# Patient Record
Sex: Male | Born: 1950 | Race: White | Hispanic: No | Marital: Married | Smoking: Never smoker
Health system: Southern US, Community
[De-identification: ages and names within clinical notes are randomized; demographics above are authoritative.]

## PROBLEM LIST (undated history)

## (undated) DIAGNOSIS — R1319 Other dysphagia: Secondary | ICD-10-CM

## (undated) DIAGNOSIS — N189 Chronic kidney disease, unspecified: Secondary | ICD-10-CM

## (undated) DIAGNOSIS — Z87442 Personal history of urinary calculi: Secondary | ICD-10-CM

## (undated) DIAGNOSIS — R1310 Dysphagia, unspecified: Secondary | ICD-10-CM

## (undated) DIAGNOSIS — Z87438 Personal history of other diseases of male genital organs: Secondary | ICD-10-CM

## (undated) DIAGNOSIS — K219 Gastro-esophageal reflux disease without esophagitis: Secondary | ICD-10-CM

## (undated) DIAGNOSIS — I10 Essential (primary) hypertension: Secondary | ICD-10-CM

## (undated) DIAGNOSIS — Z8679 Personal history of other diseases of the circulatory system: Secondary | ICD-10-CM

## (undated) DIAGNOSIS — Z8719 Personal history of other diseases of the digestive system: Secondary | ICD-10-CM

## (undated) DIAGNOSIS — M1990 Unspecified osteoarthritis, unspecified site: Secondary | ICD-10-CM

## (undated) DIAGNOSIS — Z8509 Personal history of malignant neoplasm of other digestive organs: Secondary | ICD-10-CM

## (undated) DIAGNOSIS — E785 Hyperlipidemia, unspecified: Secondary | ICD-10-CM

## (undated) HISTORY — PX: TONSILLECTOMY: SUR1361

## (undated) HISTORY — PX: LITHOTRIPSY: SUR834

## (undated) HISTORY — DX: Essential (primary) hypertension: I10

## (undated) HISTORY — DX: Chronic kidney disease, unspecified: N18.9

## (undated) HISTORY — DX: Personal history of other diseases of the digestive system: Z87.19

## (undated) HISTORY — PX: UPPER GASTROINTESTINAL ENDOSCOPY: SHX188

## (undated) HISTORY — DX: Personal history of malignant neoplasm of other digestive organs: Z85.09

## (undated) HISTORY — DX: Personal history of other diseases of the circulatory system: Z86.79

## (undated) HISTORY — DX: Other dysphagia: R13.19

## (undated) HISTORY — DX: Hyperlipidemia, unspecified: E78.5

## (undated) HISTORY — DX: Gastro-esophageal reflux disease without esophagitis: K21.9

## (undated) HISTORY — DX: Dysphagia, unspecified: R13.10

## (undated) HISTORY — DX: Personal history of other diseases of male genital organs: Z87.438

## (undated) HISTORY — DX: Unspecified osteoarthritis, unspecified site: M19.90

## (undated) HISTORY — PX: COLONOSCOPY: SHX174

---

## 2006-12-20 HISTORY — PX: ANKLE FUSION: SHX881

## 2008-09-26 ENCOUNTER — Ambulatory Visit: Payer: Self-pay | Admitting: Internal Medicine

## 2008-10-08 ENCOUNTER — Ambulatory Visit: Payer: Self-pay | Admitting: Internal Medicine

## 2008-11-29 ENCOUNTER — Telehealth: Payer: Self-pay | Admitting: Internal Medicine

## 2008-12-02 ENCOUNTER — Ambulatory Visit: Payer: Self-pay | Admitting: Internal Medicine

## 2008-12-05 ENCOUNTER — Ambulatory Visit: Payer: Self-pay | Admitting: Internal Medicine

## 2008-12-05 DIAGNOSIS — K2990 Gastroduodenitis, unspecified, without bleeding: Secondary | ICD-10-CM

## 2008-12-05 DIAGNOSIS — K2970 Gastritis, unspecified, without bleeding: Secondary | ICD-10-CM | POA: Insufficient documentation

## 2008-12-05 LAB — CONVERTED CEMR LAB: UREASE: NEGATIVE

## 2008-12-09 ENCOUNTER — Telehealth: Payer: Self-pay | Admitting: Internal Medicine

## 2008-12-09 ENCOUNTER — Encounter: Payer: Self-pay | Admitting: Gastroenterology

## 2008-12-09 DIAGNOSIS — R1906 Epigastric swelling, mass or lump: Secondary | ICD-10-CM | POA: Insufficient documentation

## 2008-12-20 HISTORY — PX: JOINT REPLACEMENT: SHX530

## 2008-12-26 ENCOUNTER — Encounter: Payer: Self-pay | Admitting: Gastroenterology

## 2008-12-26 ENCOUNTER — Ambulatory Visit (HOSPITAL_COMMUNITY): Admission: RE | Admit: 2008-12-26 | Discharge: 2008-12-26 | Payer: Self-pay | Admitting: Gastroenterology

## 2009-01-02 ENCOUNTER — Encounter: Payer: Self-pay | Admitting: Internal Medicine

## 2009-01-20 HISTORY — PX: REMOVAL OF GASTROINTESTINAL STOMATIC  TUMOR OF STOMACH: SHX6339

## 2009-01-23 ENCOUNTER — Encounter: Payer: Self-pay | Admitting: Internal Medicine

## 2009-02-11 ENCOUNTER — Inpatient Hospital Stay (HOSPITAL_COMMUNITY): Admission: RE | Admit: 2009-02-11 | Discharge: 2009-02-13 | Payer: Self-pay | Admitting: General Surgery

## 2009-02-11 ENCOUNTER — Encounter (INDEPENDENT_AMBULATORY_CARE_PROVIDER_SITE_OTHER): Payer: Self-pay | Admitting: *Deleted

## 2009-02-11 ENCOUNTER — Encounter (INDEPENDENT_AMBULATORY_CARE_PROVIDER_SITE_OTHER): Payer: Self-pay | Admitting: General Surgery

## 2009-03-06 ENCOUNTER — Encounter: Payer: Self-pay | Admitting: Internal Medicine

## 2009-11-09 IMAGING — CR DG CHEST 2V
2 series · 2 of 2 positions shown · non-contrast
Comparison: None

CLINICAL DATA: Preoperative respiratory examination for gastric
surgery.

CHEST - 2 VIEW

[view not recorded (1 of 2)]
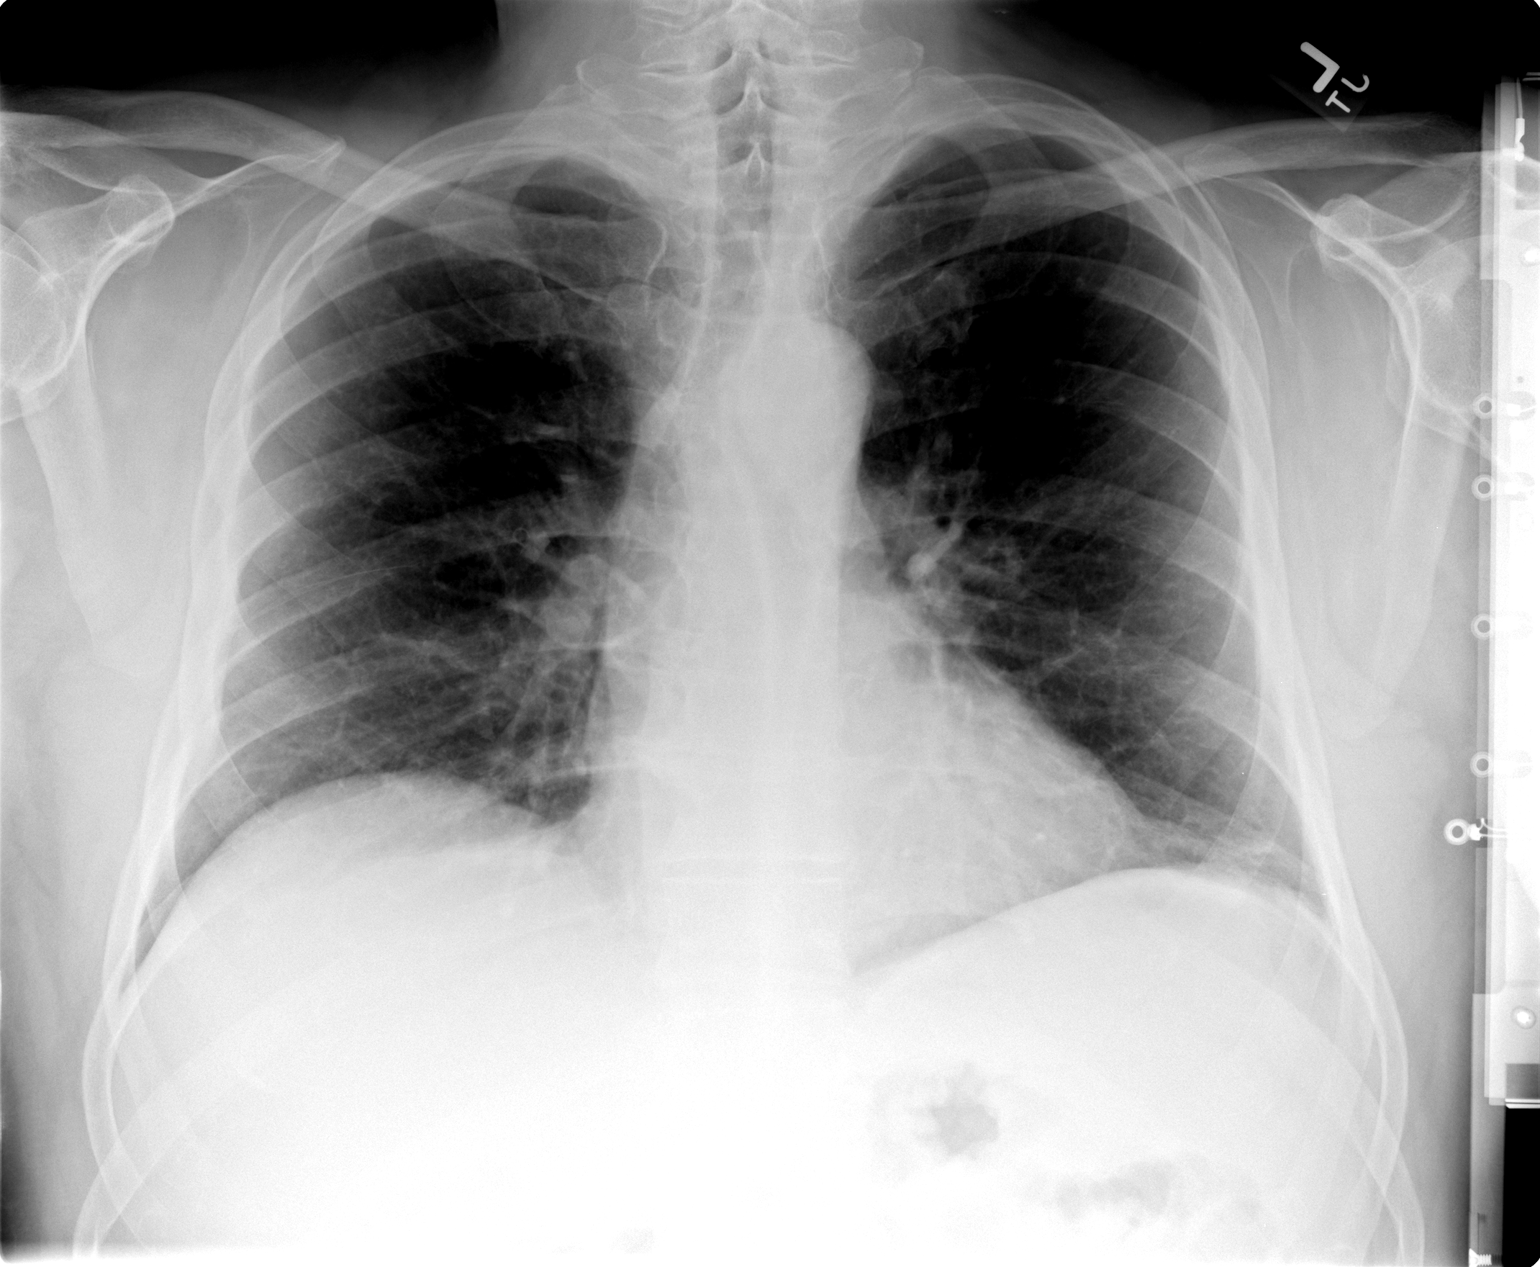

[view not recorded (2 of 2)]
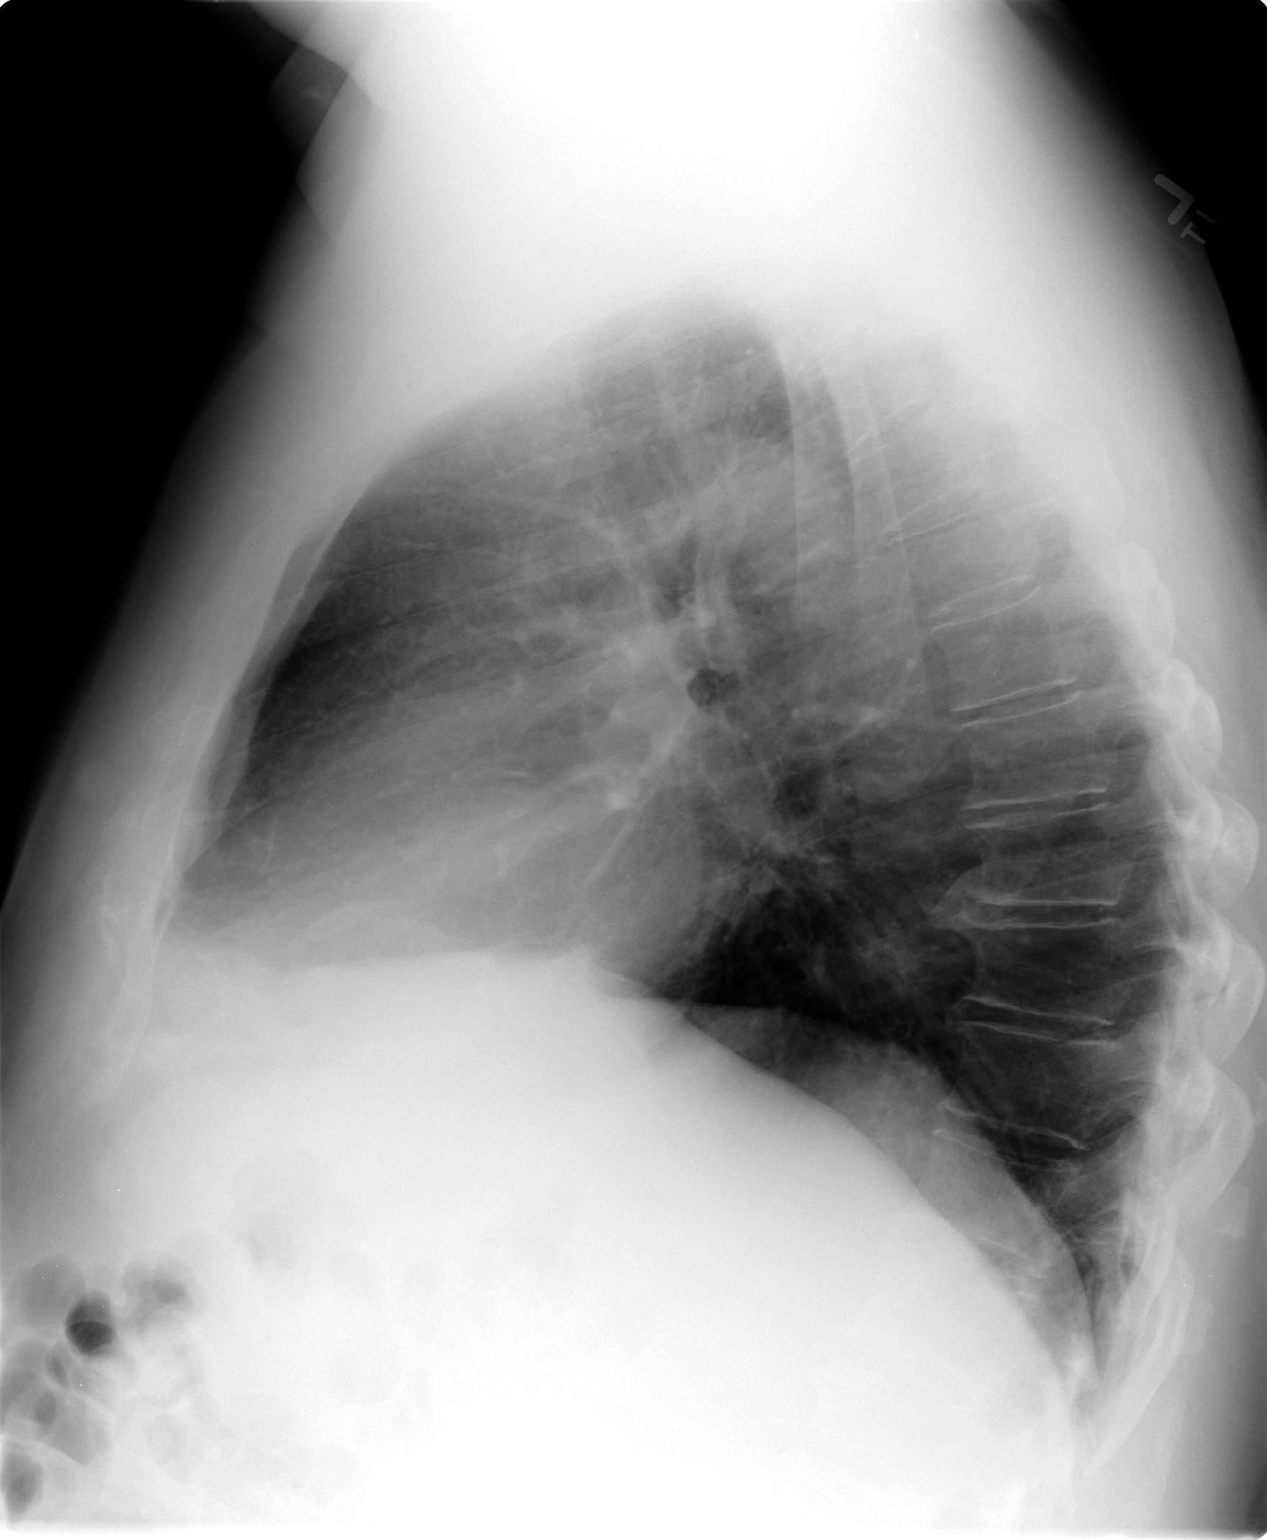

[2 of 2 positions shown; findings below may reference images not displayed]

FINDINGS: Heart size is normal.  The mediastinum is unremarkable.
The lungs show minimal scarring at the bases but no sign of active
infiltrate, mass, effusion or collapse.  Ordinary degenerative
changes effect the spine.
IMPRESSION: No active disease.  Minimal basilar scarring.

## 2010-03-16 ENCOUNTER — Encounter (INDEPENDENT_AMBULATORY_CARE_PROVIDER_SITE_OTHER): Payer: Self-pay | Admitting: *Deleted

## 2010-03-16 ENCOUNTER — Encounter: Admission: RE | Admit: 2010-03-16 | Discharge: 2010-03-16 | Payer: Self-pay | Admitting: General Surgery

## 2010-10-01 ENCOUNTER — Encounter (INDEPENDENT_AMBULATORY_CARE_PROVIDER_SITE_OTHER): Payer: Self-pay | Admitting: *Deleted

## 2010-11-11 ENCOUNTER — Ambulatory Visit: Payer: Self-pay | Admitting: Internal Medicine

## 2010-11-11 DIAGNOSIS — R1012 Left upper quadrant pain: Secondary | ICD-10-CM

## 2010-11-11 DIAGNOSIS — K219 Gastro-esophageal reflux disease without esophagitis: Secondary | ICD-10-CM

## 2010-11-11 DIAGNOSIS — K222 Esophageal obstruction: Secondary | ICD-10-CM | POA: Insufficient documentation

## 2010-11-11 DIAGNOSIS — R1310 Dysphagia, unspecified: Secondary | ICD-10-CM | POA: Insufficient documentation

## 2010-12-01 ENCOUNTER — Ambulatory Visit: Payer: Self-pay | Admitting: Internal Medicine

## 2011-01-19 NOTE — Assessment & Plan Note (Signed)
Summary: RECURRENT DYSPHAGIA...   History of Present Illness Visit Type: Initial Visit Primary GI MD: Yancey Flemings MD Primary Provider: Jiles Garter, MD Chief Complaint: Intermittant acid reflux with even taking Prilosec qd and sometimes bid. Pt has a lot of solid food dysphagia with acid reflux. Pt has left sided abd pain where GIST was removed last year. Pt states the pain is becoming increasingly worse and more frequent.  History of Present Illness:   60 year old white male with a history of hypertension, hyperlipidemia, renal nephrolithiasis, osteoarthritis, and GERD complicated by peptic stricture requiring esophageal dilation. 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 Followup CT scan March 2011 negative. Negative screening colonoscopy October 2009. He presents now with chief complaint of worsening heartburn and recurrent dysphasia. Last endoscopy with esophageal dilation December 2009 to a maximum of 54 [place] Maloney. Also erosive gastritis with negative H. pylori status. Continues to take NSAIDs. Reports symptoms for a month or so. Improved after increasing PPI to b.i.d. Currently on Prilosec 20 mg daily. Also with some left-sided abdominal discomfort near his previous incision that he notices when sitting in a car for long periods of time. Review of systems otherwise negative. He is accompanied by his wife.   GI Review of Systems    Reports abdominal pain, acid reflux, dysphagia with solids, and  heartburn.     Location of  Abdominal pain: left side.    Denies belching, bloating, chest pain, dysphagia with liquids, loss of appetite, nausea, vomiting, vomiting blood, weight loss, and  weight gain.        Denies anal fissure, black tarry stools, change in bowel habit, constipation, diarrhea, diverticulosis, fecal incontinence, heme positive stool, hemorrhoids, irritable bowel syndrome, jaundice, light color stool, liver problems, rectal  bleeding, and  rectal pain. Preventive Screening-Counseling & Management  Alcohol-Tobacco     Smoking Status: never      Drug Use:  no.      Current Medications (verified): 1)  Prilosec 20 Mg Cpdr (Omeprazole) .... One Tablet By Mouth Once Daily 2)  Aspirin 325 Mg  Tabs (Aspirin) .... One Tablet By Mouth Once Daily 3)  Flomax 0.4 Mg Caps (Tamsulosin Hcl) .... One Capsule By Mouth Once Daily 4)  Simcor 500-20 Mg Xr24h-Tab (Niacin-Simvastatin) .... One Tablet By Mouth Once Daily 5)  Enalapril Maleate 5 Mg Tabs (Enalapril Maleate) .... One Tablet By Mouth Once Daily 6)  Hydrocodone-Acetaminophen 5-500 Mg Tabs (Hydrocodone-Acetaminophen) .... One Tablet By Mouth As Needed 7)  Ibuprofen 200 Mg Caps (Ibuprofen) .... One Capsule By Mouth Once Daily As Needed  Allergies (verified): No Known Drug Allergies  Past History:  Past Medical History: GIST tumor Hypertension Kidney Stones Hyperlipidemia Arthritis GERD  Past Surgical History: Left ankle fusion Right Knee replacement GIST removal 2010 Right Knee Arthroscopy  Family History: Family History of Breast Cancer:Aunt Family History of Ovarian Cancer:Aunt, Grandmother  Social History: Msrried Self employed Patient has never smoked.  Alcohol Use - no Daily Caffeine Use Illicit Drug Use - no Smoking Status:  never Drug Use:  no  Review of Systems       The patient complains of arthritis/joint pain.  The patient denies allergy/sinus, anemia, anxiety-new, back pain, blood in urine, breast changes/lumps, change in vision, confusion, cough, coughing up blood, depression-new, fainting, fatigue, fever, headaches-new, hearing problems, heart murmur, heart rhythm changes, itching, menstrual pain, muscle pains/cramps, night sweats, nosebleeds, pregnancy symptoms, shortness of breath, skin rash, sleeping problems, sore throat, swelling of feet/legs,  swollen lymph glands, thirst - excessive , urination - excessive , urination  changes/pain, urine leakage, vision changes, and voice change.    Vital Signs:  Patient profile:   60 year old male Height:      74 inches Weight:      248 pounds BMI:     31.96 Pulse rate:   77 / minute Pulse rhythm:   regular BP sitting:   118 / 72  (left arm) Cuff size:   regular  Vitals Entered By: Christie Nottingham CMA Duncan Dull) (November 11, 2010 9:41 AM)  Physical Exam  General:  Well developed, well nourished, no acute distress. Head:  Normocephalic and atraumatic. Eyes:  PERRLA, no icterus. Nose:  No deformity, discharge,  or lesions. Mouth:  No deformity or lesions, dentition normal. Neck:  Supple; no masses or thyromegaly. Lungs:  Clear throughout to auscultation. Heart:  Regular rate and rhythm; no murmurs, rubs,  or bruits. Abdomen:  Soft, nontender and nondistended. No masses, hepatosplenomegaly or hernias noted. Normal bowel sounds. Msk:  orthopedic boot on foot Extremities:  no edema Neurologic:  Alert and  oriented x4. Skin:  Intact without significant lesions or rashes. Psych:  Alert and cooperative. Normal mood and affect.   Impression & Recommendations:  Problem # 1:  GERD (ICD-530.81) recent exacerbation of GERD. Improved with high-dose PPI. Now back on once daily PPI  Plan: #1. Reflux precautions #2. Continue once daily PPI for now #3. Will assess esophageal mucosa with EGD. See below  Problem # 2:  DYSPHAGIA UNSPECIFIED (ICD-787.20) recurrent solid food dysphagia in the face of worsening pyrosis. Somewhat improved with improving pyrosis and increased PPI temporarily. Suspect edema on top of stricture.  Plan: #1. Upper endoscopy with esophageal dilation. The nature of the procedure as well as the risks, benefits, and alternatives have been reviewed. He understood and agreed to proceed  Problem # 3:  ESOPHAGEAL STRICTURE (ICD-530.3) Assessment: Deteriorated  Orders: EGD SAV (EGD SAV)  Problem # 4:  ABDOMINAL PAIN-LUQ (ICD-789.02) positional left  upper quadrant pain most consistent with musculoskeletal etiology. Negative CT scan earlier this year. Upcoming endoscopy planned. Reassurance provided  Patient Instructions: 1)  EGD [facility] 12/01/10 8:30 am arrive at 7:30 am 2)  Upper Endoscopy brochure given.  3)  Upper Endoscopy with Dilatation brochure given.  4)  Copy sent to : Jiles Garter, MD, Glenna Fellows M.D. 5)  The medication list was reviewed and reconciled.  All changed / newly prescribed medications were explained.  A complete medication list was provided to the patient / caregiver.

## 2011-01-19 NOTE — Letter (Signed)
Summary: EGD Instructions  [facility]  [address] [place], [place] [address]   Phone: [phone]  Fax: [phone]       Brad Mcdaniel    1951/08/15    MRN: [id number]       Procedure Day /Date:TUESDAY 12/01/10     Arrival Time: 7:30 AM     Procedure Time:8:30 AM     Location of Procedure:                    [facility] (4th Floor)   PREPARATION FOR ENDOSCOPY WITH DIL.   On TUESDAY 12/01/10 THE DAY OF THE PROCEDURE:  1.   No solid foods, milk or milk products are allowed after midnight the night before your procedure.  2.   Do not drink anything colored red or purple.  Avoid juices with pulp.  No orange juice.  3.  You may drink clear liquids until 6:30 AM, which is 2 hours before your procedure.                                                                                                CLEAR LIQUIDS INCLUDE: Water Jello Ice Popsicles Tea (sugar ok, no milk/cream) Powdered fruit flavored drinks Coffee (sugar ok, no milk/cream) Gatorade Juice: apple, white grape, white cranberry  Lemonade Clear bullion, consomm, broth Carbonated beverages (any kind) Strained chicken noodle soup Hard Candy   MEDICATION INSTRUCTIONS  Unless otherwise instructed, you should take regular prescription medications with a small sip of water as early as possible the morning of your procedure.         OTHER INSTRUCTIONS  You will need a responsible adult at least 60 years of age to accompany you and drive you home.   This person must remain in the waiting room during your procedure.  Wear loose fitting clothing that is easily removed.  Leave jewelry and other valuables at home.  However, you may wish to bring a book to read or an iPod/MP3 player to listen to music as you wait for your procedure to start.  Remove all body piercing jewelry and leave at home.  Total time from sign-in until discharge is approximately 2-3 hours.  You should go home  directly after your procedure and rest.  You can resume normal activities the day after your procedure.  The day of your procedure you should not:   Drive   Make legal decisions   Operate machinery   Drink alcohol   Return to work  You will receive specific instructions about eating, activities and medications before you leave.    The above instructions have been reviewed and explained to me by   _______________________    I fully understand and can verbalize these instructions _____________________________ Date _________

## 2011-01-19 NOTE — Letter (Signed)
Summary: New Patient letter  [facility]  [address] [place], [place] [address]   Phone: [phone]  Fax: [phone]       10/01/2010 MRN: [id number]  Brad Mcdaniel [address] [place], [place]  [address]  Dear Brad Mcdaniel,  Welcome to the Gastroenterology Division at [facility].    You are scheduled to see Dr. Marina Goodell on 11/11/2010 at 9:15AM on the 3rd floor at [facility], [address]  We ask that you try to arrive at our office 15 minutes prior to your appointment time to allow for check-in.  We would like you to complete the enclosed self-administered evaluation form prior to your visit and bring it with you on the day of your appointment.  We will review it with you.  Also, please bring a complete list of all your medications or, if you prefer, bring the medication bottles and we will list them.  Please bring your insurance card so that we may make a copy of it.  If your insurance requires a referral to see a specialist, please bring your referral form from your primary care physician.  Co-payments are due at the time of your visit and may be paid by cash, check or credit card.     Your office visit will consist of a consult with your physician (includes a physical exam), any laboratory testing he/she may order, scheduling of any necessary diagnostic testing (e.g. x-ray, ultrasound, CT-scan), and scheduling of a procedure (e.g. Endoscopy, Colonoscopy) if required.  Please allow enough time on your schedule to allow for any/all of these possibilities.    If you cannot keep your appointment, please call [phone] to cancel or reschedule prior to your appointment date.  This allows [place] the opportunity to schedule an appointment for another patient in need of care.  If you do not cancel or reschedule by 5 p.m. the business day prior to your appointment date, you will be charged a $50.00 late cancellation/no-show fee.    Thank you for choosing [facility] for your medical needs.  We appreciate the opportunity to care for you.  Please visit [place] at our website  to learn more about our practice.                     Sincerely,                                                             The Gastroenterology Division

## 2011-01-21 NOTE — Miscellaneous (Signed)
Summary: wants Rx mailed   Clinical Lists Changes  Medications: Added new medication of PRILOSEC OTC 20 MG  TBEC (OMEPRAZOLE MAGNESIUM) 1 twice a day 30 minutes before meals - Signed Rx of PRILOSEC OTC 20 MG  TBEC (OMEPRAZOLE MAGNESIUM) 1 twice a day 30 minutes before meals;  #62 x 11;  Signed;  Entered by: Doristine Church RN II;  Authorized by: Hilarie Fredrickson MD;  Method used: Print then Give to Patient Observations: Added new observation of ALLERGY REV: Done (12/02/2010 8:22) Added new observation of NKA: T (12/02/2010 8:22)    Prescriptions: PRILOSEC OTC 20 MG  TBEC (OMEPRAZOLE MAGNESIUM) 1 twice a day 30 minutes before meals  #62 x 11   Entered by:   Doristine Church RN II   Authorized by:   Hilarie Fredrickson MD   Signed by:   Doristine Church RN II on 12/02/2010   Method used:   Print then Give to Patient   RxID:   [id number]

## 2011-01-21 NOTE — Procedures (Signed)
Summary: Upper Endoscopy  Patient: [name] Note: All result statuses are Final unless otherwise noted.  Tests: (1) Upper Endoscopy (EGD)   EGD Upper Endoscopy       DONE      [facility]     [address]     [place], [place]  [address]           ENDOSCOPY PROCEDURE REPORT           PATIENT:  Brad Mcdaniel, Brad Mcdaniel  MR#:  [id number]     BIRTHDATE:  06-02-1951, 59 yrs. old  GENDER:  male           ENDOSCOPIST:  Wilhemina Bonito. Eda Keys, MD     Referred by:  Office           PROCEDURE DATE:  12/01/2010     PROCEDURE:  EGD, diagnostic 43235,     Maloney Dilation of Esophagus - 42F     ASA CLASS:  Class II     INDICATIONS:  dysphagia, dilation of esophageal stricture           MEDICATIONS:   Fentanyl 75 mcg IV, Versed 7 mg IV     TOPICAL ANESTHETIC:  Exactacain Spray           DESCRIPTION OF PROCEDURE:   After the risks benefits and     alternatives of the procedure were thoroughly explained, informed     consent was obtained.  The LB GIF-H180 [id number] endoscope was     introduced through the mouth and advanced to the second portion of     the duodenum, without limitations.  The instrument was slowly     withdrawn as the mucosa was fully examined.     <<PROCEDUREIMAGES>>           A benign stricture was found in the distal esophagus. Esophagitis     was found in the distal esophagus as well. Staples from prior     sugery in proximal stomach.   Retroflexed views revealed no     abnormalities. Otherwise, normal exam to D2.    The scope was then     withdrawn from the patient and the procedure completed.           THERAPY: MALONEY DILATION 42F W/O RESISTANCE OR HEME. TOLERATED     WELL           COMPLICATIONS:  None           ENDOSCOPIC IMPRESSION:     1) Stricture in the distal esophagus - S/P DILATION 42F     2) Esophagitis in the distal esophagus     3) Prior surgery     4) GERD           RECOMMENDATIONS:     1) Clear liquids until 11 am, then soft foods rest of day.  Resume prior diet tomorrow.     2) Continue PRILOSEC 20MG  TWICE DAILY           ______________________________     Wilhemina Bonito. Eda Keys, MD           CC:  Cheri Rous, MD, Glenna Fellows, MD, The Patient           n.     eSIGNED:   Wilhemina Bonito. Eda Keys at 12/01/2010 09:01 AM           Adalberto Cole, [id number]  Note: An exclamation mark (!) indicates a result that was not dispersed into the  flowsheet. Document Creation Date: 12/01/2010 9:01 AM _______________________________________________________________________  (1) Order result status: Final Collection or observation date-time: 12/01/2010 08:45 Requested date-time:  Receipt date-time:  Reported date-time:  Referring Physician:   Ordering Physician: Fransico Setters [phone]) Specimen Source:  Source: Launa Grill Order Number: [id number] Lab site:

## 2011-04-06 LAB — COMPREHENSIVE METABOLIC PANEL
Alkaline Phosphatase: 59 U/L (ref 39–117)
BUN: 15 mg/dL (ref 6–23)
CO2: 27 mEq/L (ref 19–32)
Chloride: 106 mEq/L (ref 96–112)
Creatinine, Ser: 0.96 mg/dL (ref 0.4–1.5)
GFR calc Af Amer: >60 mL/min (ref >60)
Potassium: 4.1 mEq/L (ref 3.5–5.1)
Sodium: 140 mEq/L (ref 135–145)

## 2011-04-06 LAB — CBC
HCT: 44.8 % (ref 39.0–52.0)
MCHC: 33.5 g/dL (ref 30.0–36.0)
MCV: 89.2 fL (ref 78.0–100.0)
Platelets: 216 10*3/uL (ref 150–400)
RBC: 5.02 MIL/uL (ref 4.22–5.81)
RDW: 13.5 % (ref 11.5–15.5)

## 2011-04-06 LAB — DIFFERENTIAL
Basophils Absolute: 0 10*3/uL (ref 0.0–0.1)
Basophils Relative: 0 % (ref 0–1)
Eosinophils Absolute: 0.2 10*3/uL (ref 0.0–0.7)
Lymphocytes Relative: 29 % (ref 12–46)
Lymphs Abs: 2 10*3/uL (ref 0.7–4.0)
Monocytes Absolute: 0.8 10*3/uL (ref 0.1–1.0)
Monocytes Relative: 12 % (ref 3–12)
Neutro Abs: 3.9 10*3/uL (ref 1.7–7.7)

## 2011-04-06 LAB — URINALYSIS, ROUTINE W REFLEX MICROSCOPIC
Nitrite: NEGATIVE
Specific Gravity, Urine: 1.026 (ref 1.005–1.030)

## 2011-05-04 NOTE — Op Note (Signed)
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   SURGICAL PROCEDURES:  Laparoscopic excision gastrointestinal stromal  tumor, proximal stomach.   SURGEON:  Brad Mcdaniel. Mcdaniel, M.D.   ANESTHESIA:  General.   BRIEF HISTORY:  Brad Mcdaniel is a 60 year old male who recently underwent  upper endoscopy by Dr. Marina Mcdaniel for symptoms of indigestion and reflux.  He  had a distal esophageal stricture and some erosions which were dilated,  but incidentally was noted an approximately 2.5-cm submucosal mass in  the proximal stomach.  The patient has subsequently undergone ultrasound  and fine needle aspiration by Dr. Christella Mcdaniel, revealing a 2.3-cm homogeneous  lesion in the muscularis propria with normal mucosa, and fine-needle  aspiration showed spindle cells consistent with GIST.  With these  findings, I have recommended proceeding with laparoscopic and possible  open resection.   INDICATIONS FOR THE PROCEDURE:  Risks of anesthetic complications,  bleeding, infection, leakage were discussed with the patient and his  family.  He is now brought to the operating room for this procedure.   DESCRIPTION OF OPERATION:  The patient was brought to the operating room  and placed in the supine position on the operating table, and general  endotracheal anesthesia was induced.  The abdomen was widely sterilely  prepped and draped.  Correct patient and procedure were verified.  He  had received preoperative IV antibiotics and pneumatic and  embolism  stockings were in place.  Trocar sites were infiltrated with local  anesthesia.  Access was obtained in the left subcostal space with  Optiview trocar without difficulty and pneumoperitoneum established.  There was no evidence of trocar injury.  Under direct vision, a 12-mm  trocar was placed well laterally in the right upper quadrant, another 12-  mm trocar in the right mid abdomen, and an 11-mm trocar to the left and  above the umbilicus for the camera.  Through a 5-mm subxiphoid site,  Nathanson retractor was placed and the left lobe of liver elevated.  There were some adhesions of the omentum up to the diaphragm in the left  upper quadrant for reasons that were not clear and I did not take these  down as they were not in the way.  The anterior stomach proximally was  exposed and palpating along the anterior stomach I did feel a firm mass.  Upper endoscopy was then performed by Dr. Daphine Mcdaniel and we could visualize  the tumor nicely in the proximal stomach, and it seemed to be along the  greater curve about 4-6 cm from the EG junction.  At this point, we went  ahead and did an anterior  gastrotomy with the Harmonic scalpel and  opened the anterior gastric wall for 4-5 cm.  We were then able to get a  good view of the gastric wall intraluminally and the tumor was actually  sitting in the mid posterior gastric wall at this level.  It was very  mobile and we elected to resect it transgastric through the anterior  gastrotomy we had created.  I placed a figure-of-eight suture deeply  through the mass, and then was able to elevate it up through the  anterior gastrotomy, again quite mobile, and we placed a 60-mm gold  Echelon stapler posterior to the mass and got what looked like a very  nice gross margin of normal gastric wall behind the mass.  The stapler  was then fired and the specimen attached and placed in the EndoCatch  bag.  This was left secured with a suture through one of the  trocar  sites for later retrieval.  The posterior gastric staple line was then  oversewn with a running 2-0 Vicryl suture for hemostasis.  There was no  bleeding.  We then proceeded to close the anterior gastrotomy.  This was  done with a full-thickness running 2-0 Vicryl suture begun at either end  of the gastrotomy and tied centrally using the Endo Stitch.  Following  this, an anterior inverting seromuscular suture of 2-0 silk was used.  At this point, Dr. Daphine Mcdaniel again performed upper endoscopy.  The sutured  staple lines appeared intact and without bleeding, and with the stomach  distended there was no evidence of any air leak under saline irrigation.  The stomach was then desufflated.  The anterior gastrotomy was  additionally coated with Tisseel tissue sealant.  There was no evidence  of trocar injury or bleeding.  The Nathanson retractor was removed under  direct vision.  The specimen was removed through the left upper quadrant  trocar site after dilating it slightly with a ring clamp.  All CO2 was  evacuated and trocars removed.  Skin incisions were closed with  subcuticular Monocryl and Dermabond.  Sponge, needle and instrument  counts were correct.  The patient was taken to recovery in good  condition.      Brad Mcdaniel. Mcdaniel, M.D.  Electronically Signed     BTH/MEDQ  D:  02/11/2009  T:  02/12/2009  Job:  [id number]

## 2011-12-24 ENCOUNTER — Telehealth: Payer: Self-pay

## 2011-12-24 NOTE — Telephone Encounter (Signed)
Left message regarding faxed prilosec request

## 2011-12-29 ENCOUNTER — Telehealth: Payer: Self-pay

## 2011-12-29 NOTE — Telephone Encounter (Signed)
Called Ms. Bullinger to verify that I was going to [employer] rx

## 2012-03-21 ENCOUNTER — Encounter (INDEPENDENT_AMBULATORY_CARE_PROVIDER_SITE_OTHER): Payer: Self-pay | Admitting: General Surgery

## 2012-05-19 ENCOUNTER — Ambulatory Visit (INDEPENDENT_AMBULATORY_CARE_PROVIDER_SITE_OTHER): Payer: Commercial Managed Care - PPO | Admitting: General Surgery

## 2012-05-19 ENCOUNTER — Encounter (INDEPENDENT_AMBULATORY_CARE_PROVIDER_SITE_OTHER): Payer: Self-pay | Admitting: General Surgery

## 2012-05-19 VITALS — BP 130/84 | HR 71 | Temp 98.1°F | Resp 16 | Ht 73.5 in | Wt 236.8 lb

## 2012-05-19 DIAGNOSIS — D214 Benign neoplasm of connective and other soft tissue of abdomen: Secondary | ICD-10-CM

## 2012-05-19 NOTE — Progress Notes (Signed)
Chief complaint: Followup GI ST tumor of stomach  History: Patient returns for long-term followup status post laparoscopic excision of a 3.5 cm GIST of the proximal stomach.  He has been feeling well. He has lost quite a bit of weight and has had return of his energy. He denies any abdominal pain, nausea, bloating, difficulty with eating, change in his bowel movements, melena or hematochezia. He has not had any endoscopy or other imaging studies since I saw him one year ago.  Exam: Gen.: Appears well Nodes: No cervical, super clavicular, or no nodes palpable Lungs: Clear equal breath sounds Abdomen: Soft and nontender. Wounds well healed without hernias. No discernible masses or organomegaly.  Assessment and plan: Clinically doing well now over 3 years following resection of GI ST tumor of the proximal stomach. To be complete I think that one followup CT scan would be helpful to rule out any evidence of local recurrence. He will look into doing that possibly this year. Return in one year.

## 2013-03-08 ENCOUNTER — Other Ambulatory Visit (INDEPENDENT_AMBULATORY_CARE_PROVIDER_SITE_OTHER): Payer: Self-pay

## 2013-03-09 ENCOUNTER — Telehealth (INDEPENDENT_AMBULATORY_CARE_PROVIDER_SITE_OTHER): Payer: Self-pay | Admitting: General Surgery

## 2013-03-09 NOTE — Telephone Encounter (Signed)
Left message with  appt day and time 03/13/13 @ 2pm  No solids 4 hrs prior  He needs to pick up  Contrast 03/12/13

## 2013-03-13 ENCOUNTER — Ambulatory Visit
Admission: RE | Admit: 2013-03-13 | Discharge: 2013-03-13 | Disposition: A | Discharge status: Home / Self Care | Payer: Self-pay | Source: Ambulatory Visit | Attending: General Surgery | Admitting: General Surgery

## 2013-03-13 DIAGNOSIS — D214 Benign neoplasm of connective and other soft tissue of abdomen: Secondary | ICD-10-CM

## 2013-03-13 MED ORDER — IOHEXOL 300 MG/ML  SOLN
125.0000 mL | Freq: Once | INTRAMUSCULAR | Status: AC | PRN
  Administered 2013-03-13: 125 mL via INTRAVENOUS

## 2013-03-16 ENCOUNTER — Telehealth (INDEPENDENT_AMBULATORY_CARE_PROVIDER_SITE_OTHER): Payer: Self-pay

## 2013-03-16 NOTE — Telephone Encounter (Signed)
Patient given CT results (no recurrent GIST tumor or metastatic disease)

## 2013-04-20 ENCOUNTER — Ambulatory Visit (INDEPENDENT_AMBULATORY_CARE_PROVIDER_SITE_OTHER): Payer: Commercial Managed Care - PPO | Admitting: General Surgery

## 2016-03-22 DIAGNOSIS — E785 Hyperlipidemia, unspecified: Secondary | ICD-10-CM | POA: Diagnosis not present

## 2016-03-26 DIAGNOSIS — I10 Essential (primary) hypertension: Secondary | ICD-10-CM | POA: Diagnosis not present

## 2016-03-26 DIAGNOSIS — E782 Mixed hyperlipidemia: Secondary | ICD-10-CM | POA: Diagnosis not present

## 2016-03-26 DIAGNOSIS — M19079 Primary osteoarthritis, unspecified ankle and foot: Secondary | ICD-10-CM | POA: Diagnosis not present

## 2016-03-26 DIAGNOSIS — M19072 Primary osteoarthritis, left ankle and foot: Secondary | ICD-10-CM | POA: Diagnosis not present

## 2016-11-26 DIAGNOSIS — S92414A Nondisplaced fracture of proximal phalanx of right great toe, initial encounter for closed fracture: Secondary | ICD-10-CM | POA: Diagnosis not present

## 2017-01-07 DIAGNOSIS — M1712 Unilateral primary osteoarthritis, left knee: Secondary | ICD-10-CM | POA: Diagnosis not present

## 2017-03-22 DIAGNOSIS — M25572 Pain in left ankle and joints of left foot: Secondary | ICD-10-CM | POA: Diagnosis not present

## 2017-03-22 DIAGNOSIS — M25571 Pain in right ankle and joints of right foot: Secondary | ICD-10-CM | POA: Diagnosis not present

## 2017-03-22 DIAGNOSIS — G8929 Other chronic pain: Secondary | ICD-10-CM | POA: Diagnosis not present

## 2017-03-23 DIAGNOSIS — Z23 Encounter for immunization: Secondary | ICD-10-CM | POA: Diagnosis not present

## 2017-03-23 DIAGNOSIS — Z6834 Body mass index (BMI) 34.0-34.9, adult: Secondary | ICD-10-CM | POA: Diagnosis not present

## 2017-03-23 DIAGNOSIS — Z9181 History of falling: Secondary | ICD-10-CM | POA: Diagnosis not present

## 2017-03-23 DIAGNOSIS — Z1389 Encounter for screening for other disorder: Secondary | ICD-10-CM | POA: Diagnosis not present

## 2017-03-23 DIAGNOSIS — Z0000 Encounter for general adult medical examination without abnormal findings: Secondary | ICD-10-CM | POA: Diagnosis not present

## 2017-03-24 DIAGNOSIS — N3020 Other chronic cystitis without hematuria: Secondary | ICD-10-CM | POA: Diagnosis not present

## 2017-03-24 DIAGNOSIS — N201 Calculus of ureter: Secondary | ICD-10-CM | POA: Diagnosis not present

## 2017-03-24 DIAGNOSIS — N200 Calculus of kidney: Secondary | ICD-10-CM | POA: Diagnosis not present

## 2017-03-25 ENCOUNTER — Other Ambulatory Visit: Payer: Self-pay | Admitting: Orthopedic Surgery

## 2017-03-29 ENCOUNTER — Other Ambulatory Visit: Payer: Self-pay | Admitting: Orthopedic Surgery

## 2017-03-29 DIAGNOSIS — G8929 Other chronic pain: Secondary | ICD-10-CM

## 2017-03-29 DIAGNOSIS — M25572 Pain in left ankle and joints of left foot: Principal | ICD-10-CM

## 2017-03-29 DIAGNOSIS — M25571 Pain in right ankle and joints of right foot: Principal | ICD-10-CM

## 2017-04-05 ENCOUNTER — Other Ambulatory Visit: Payer: Self-pay | Admitting: Orthopedic Surgery

## 2017-04-05 DIAGNOSIS — G8929 Other chronic pain: Secondary | ICD-10-CM

## 2017-04-05 DIAGNOSIS — E785 Hyperlipidemia, unspecified: Secondary | ICD-10-CM | POA: Diagnosis not present

## 2017-04-05 DIAGNOSIS — M25571 Pain in right ankle and joints of right foot: Principal | ICD-10-CM

## 2017-04-08 DIAGNOSIS — I10 Essential (primary) hypertension: Secondary | ICD-10-CM | POA: Diagnosis not present

## 2017-04-12 ENCOUNTER — Ambulatory Visit
Admission: RE | Admit: 2017-04-12 | Discharge: 2017-04-12 | Disposition: A | Discharge status: Home / Self Care | Payer: PPO | Source: Ambulatory Visit | Attending: Orthopedic Surgery | Admitting: Orthopedic Surgery

## 2017-04-12 DIAGNOSIS — M19071 Primary osteoarthritis, right ankle and foot: Secondary | ICD-10-CM | POA: Diagnosis not present

## 2017-04-12 DIAGNOSIS — G8929 Other chronic pain: Secondary | ICD-10-CM

## 2017-04-12 DIAGNOSIS — M25571 Pain in right ankle and joints of right foot: Principal | ICD-10-CM

## 2017-04-29 DIAGNOSIS — M19071 Primary osteoarthritis, right ankle and foot: Secondary | ICD-10-CM | POA: Diagnosis not present

## 2017-05-06 ENCOUNTER — Ambulatory Visit: Payer: Self-pay | Admitting: Orthopedic Surgery

## 2017-05-16 ENCOUNTER — Ambulatory Visit: Payer: Self-pay | Admitting: Orthopedic Surgery

## 2017-05-16 NOTE — H&P (Signed)
Brad Mcdaniel DOB: 04/28/1951 Married / Language: Brad Mcdaniel / Race: White Male Date of Admission:  05/23/2017 Left Knee Pain History of Present Illness The patient is a 66 year old male who comes in for a preoperative History and Physical. The patient is scheduled for a left total knee arthroplasty to be performed by Dr. Dione Plover. [name], MD at [facility] on 05-23-2017. The patient is a 66 year old male who presented with knee complaints. The patient reports left knee symptoms including: pain, swelling, locking, catching, giving way, weakness, soreness and grinding which began 7 year(s) ago in association with an established activity (pt. had left ankle sx). The patient describes their pain as sharp, dull, aching and throbbing.The patient feels that the symptoms are worsening. This problem has not been previously evaluated. Past treatment for this problem has included application of ice and application of heat. Symptoms are reported to be located in the left knee and include knee pain, swelling, stiffness, decreased range of motion, instability and difficulty bearing weight. The patient reports that symptoms radiate to the left thigh (muscle cramps). Symptoms are exacerbated by motion at the knee and weight bearing. Symptoms are relieved by rest. Current treatment includes use of a walker (cane) and nonsteroidal anti-inflammatory drugs (Ibuprofen). Note for "Knee pain": left ankle sx causing pt. to walk different. HX right uni knee by Dr. Smitty Cords.  [name] was seen as a new evaluaiton of the left knee accompanied by his wife Remo Lipps. He was referred over to the clininc by his brother, Abdirahman Chittum, who has had both knees replaced. Catherine has been having issues with the left knee for several years, 3-4, and has been progressive especially over the past couple of years. It has been prgressive in nature and has impacted his activity and mobility. 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  He is unable to work on ladders for any length of time and tries to avoids steps due to pain. He feels the leg is getting weaker with time. He will have cramps in both thighs at times. The knee has reached a point where is hurts every day and has to use Ibuprofen every for it.  The knee pops, grinds, but no buckling. He does have night pain and sedintary stiffness. He is unalbe to ride in the car for any length of time before he has to get out and walk around. The left knee is not as bad as the right knee was before he had the partial knee placed in it. The right knee partial was done about 7 years ago and this knee wore out because his left ankle was fused about 8 or so years ago and the right knee assumed the weight as his gait shifted following the ankle fusion.  Left knee is bothering him most times now. He said that he has become inactive because of the left knee. He has pain, but even worse than the pain is the dysfunction. He cannot do anything he desires because the knee will not hold up for him. He wants to give out. It is definitely limiting what he can and cannot do. He is at a stage now where he would like to get this knee fixed. He has had a previous unicompartmental replacement in the right knee. He has done fairly well with that. He does not really have much pain with it. He says that he is functioning a lot better now than he did before the  surgery. He is ready to get the left knee fixed at this time.  They have been treated conservatively in the past for the above stated problem and despite conservative measures, they continue to have progressive pain and severe functional limitations and dysfunction. They have failed non-operative management including home exercise, medications. It is felt that they would benefit from undergoing total joint replacement. Risks and benefits of the procedure have been discussed with the patient and they elect to proceed with surgery. There are no active  contraindications to surgery such as ongoing infection or rapidly progressive neurological disease.  Problem List/Past Medical Primary osteoarthritis of left knee (M17.12)  Chronic pain of both ankles (M25.571, M25.572)  Gastroesophageal Reflux Disease  High blood pressure  Hypercholesterolemia  Kidney Stone  Prostate Disease  Enlarged Prostate  Allergies No Known Drug Allergies   Family History Cancer  Maternal Grandmother. Hypertension  Mother. Kidney disease  Father. Osteoarthritis  Brother. Father  Deceased. Auto Accident Mother  Deceased. Alzheimer's  Social History Children  3 Current work status  retired Furniture conservator/restorer weekly; does running / walking Living situation  live with spouse Marital status  married Never consumed alcohol  01/07/2017: Never consumed alcohol No history of drug/alcohol rehab  Not under pain contract  Number of flights of stairs before winded  greater than 5 Tobacco / smoke exposure  01/07/2017: no Tobacco use  Never smoker. 01/07/2017  Medication History  Multiple Vitamin (1 (one) Oral) Active. Pravastatin Sodium (40MG  Tablet, Oral) Active. Lisinopril (5MG  Tablet, Oral) Active. Finasteride (5MG  Tablet, Oral) Active. Tamsulosin HCl (0.4MG  Capsule, Oral) Active. Ibuprofen (200MG  Capsule, 1 (one) Oral) Active. Aspirin (81MG  Tablet, 1 (one) Oral) Active. PriLOSEC (20MG  Capsule DR, Oral) Active.  Past Surgical History  Ankle Surgery  left Lithotripsy  Date: 2005. Left Knee Scope  Date: 06/2007. Left Ankle Fusion  Date: 11/2007. Right Knee Scope  Date: 05/2008. 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 Right Knee Unicompartmental Replacement  Date: 10/2009.  Review of Systems General Not Present- Chills, Fatigue, Fever, Memory Loss, Night Sweats, Weight Gain and Weight Loss. Skin Not Present- Eczema, Hives, Itching, Lesions and Rash. HEENT Not Present- Dentures, Double Vision, Headache, Hearing Loss,  Tinnitus and Visual Loss. Respiratory Not Present- Allergies, Chronic Cough, Coughing up blood, Shortness of breath at rest and Shortness of breath with exertion. Cardiovascular Not Present- Chest Pain, Difficulty Breathing Lying Down, Murmur, Palpitations, Racing/skipping heartbeats and Swelling. Gastrointestinal Present- Heartburn. Not Present- Abdominal Pain, Bloody Stool, Constipation, Diarrhea, Difficulty Swallowing, Jaundice, Loss of appetitie, Nausea and Vomiting. Male Genitourinary Not Present- Blood in Urine, Discharge, Flank Pain, Incontinence, Painful Urination, Urgency, Urinary frequency, Urinary Retention, Urinating at Night and Weak urinary stream. Musculoskeletal Present- Joint Pain. Not Present- Back Pain, Joint Swelling, Morning Stiffness, Muscle Pain, Muscle Weakness and Spasms. Neurological Not Present- Blackout spells, Difficulty with balance, Dizziness, Paralysis, Tremor and Weakness. Psychiatric Not Present- Insomnia.  Vitals Weight: 233 lb Height: 73in Weight was reported by patient. Height was reported by patient. Body Surface Area: 2.3 m Body Mass Index: 30.74 kg/m  Pulse: 64 (Regular)  BP: 128/66 (Sitting, Right Arm, Standard)   Physical Exam General Mental Status -Alert, cooperative and good historian. General Appearance-pleasant, Not in acute distress. Orientation-Oriented X3. Build & Nutrition-Well nourished and Well developed.  Head and Neck Head-normocephalic, atraumatic . Neck Global Assessment - supple, no bruit auscultated on the right, no bruit auscultated on the left.  Eye Vision-Wears corrective lenses(readers). Pupil - Bilateral-Regular and Round. Motion - Bilateral-EOMI.  ENMT Note:  partial upper denture plate   Chest and Lung Exam Auscultation Breath sounds - clear at anterior chest wall and clear at posterior chest wall. Adventitious sounds - No Adventitious sounds.  Cardiovascular Auscultation Rhythm -  Regular rate and rhythm. Heart Sounds - S1 WNL and S2 WNL. Murmurs & Other Heart Sounds - Auscultation of the heart reveals - No Murmurs.  Abdomen Palpation/Percussion Tenderness - Abdomen is non-tender to palpation. Rigidity (guarding) - Abdomen is soft. Auscultation Auscultation of the abdomen reveals - Bowel sounds normal.  Male Genitourinary Note: Not done, not pertinent to present illness   Musculoskeletal Note: He is a well-developed male, alert and oriented, in no apparent distress. Evaluation of his hips show normal range of motion with no discomfort. His right knee shows no swelling. Range about 0 to 125 with no tenderness, crepitus or instability. Left knee, no effusion, slight varus, range 5 to 115. There is marked crepitus on range of motion of the right knee. He is tender medial greater than lateral with no instability noted. He has a fused left ankle. Pulse, sensation and motor are intact both lower extremities.  His radiographs, AP and lateral of that left knee show bone on bone arthritis in the medial and patellofemoral compartments with tibial subluxation and varus deformity.   Assessment & Plan  Primary osteoarthritis of left knee (M17.12)  Note:Surgical Plans: Left Total Knee Replacement  Disposition: Home, Straight to outpatient at [facility] in [place], [place] to start on June 7th or June 8th  PCP: Dr. Wyline Copas - Patient has been seen preoperatively and felt to be stable for surgery.  IV TXA  Anesthesia Issues: None  Patient was instructed on what medications to stop prior to surgery.  Signed electronically by Joelene Millin, III PA-C

## 2017-05-17 ENCOUNTER — Other Ambulatory Visit (HOSPITAL_COMMUNITY): Payer: Self-pay | Admitting: Emergency Medicine

## 2017-05-17 NOTE — Patient Instructions (Signed)
Brad Mcdaniel  05/17/2017   Your procedure is scheduled on: 05-23-17  Report to [facility] Main  Entrance    Report to admitting at 6AM    Call this number if you have problems the morning of surgery  [phone]   Remember: ONLY 1 PERSON MAY GO WITH YOU TO SHORT STAY TO GET  READY MORNING OF YOUR SURGERY.  Do not eat food or drink liquids :After Midnight.     Take these medicines the morning of surgery with A SIP OF WATER: omeprazole(prilosec), finasteride(flomax), tamsulosin(flomax), pravastatin(pravachol)                                You may not have any metal on your body including hair pins and              piercings  Do not wear jewelry, make-up, lotions, powders or perfumes, deodorant              Men may shave face and neck.   Do not bring valuables to the hospital. [place].  Contacts, dentures or bridgework may not be worn into surgery.  Leave suitcase in the car. After surgery it may be brought to your room.               Please read over the following fact sheets you were given: _____________________________________________________________________   [facility] - Preparing for Surgery Before surgery, you can play an important role.  Because skin is not sterile, your skin needs to be as free of germs as possible.  You can reduce the number of germs on your skin by washing with CHG (chlorahexidine gluconate) soap before surgery.  CHG is an antiseptic cleaner which kills germs and bonds with the skin to continue killing germs even after washing. Please DO NOT use if you have an allergy to CHG or antibacterial soaps.  If your skin becomes reddened/irritated stop using the CHG and inform your nurse when you arrive at Short Stay. Do not shave (including legs and underarms) for at least 48 hours prior to the first CHG shower.  You may shave your face/neck. Please follow these instructions  carefully:  1.  Shower with CHG Soap the night before surgery and the  morning of Surgery.  2.  If you choose to wash your hair, wash your hair first as usual with your  normal  shampoo.  3.  After you shampoo, rinse your hair and body thoroughly to remove the  shampoo.                           4.  Use CHG as you would any other liquid soap.  You can apply chg directly  to the skin and wash                       Gently with a scrungie or clean washcloth.  5.  Apply the CHG Soap to your body ONLY FROM THE NECK DOWN.   Do not use on face/ open  Wound or open sores. Avoid contact with eyes, ears mouth and genitals (private parts).                       Wash face,  Genitals (private parts) with your normal soap.             6.  Wash thoroughly, paying special attention to the area where your surgery  will be performed.  7.  Thoroughly rinse your body with warm water from the neck down.  8.  DO NOT shower/wash with your normal soap after using and rinsing off  the CHG Soap.                9.  Pat yourself dry with a clean towel.            10.  Wear clean pajamas.            11.  Place clean sheets on your bed the night of your first shower and do not  sleep with pets. Day of Surgery : Do not apply any lotions/deodorants the morning of surgery.  Please wear clean clothes to the hospital/surgery center.  FAILURE TO FOLLOW THESE INSTRUCTIONS MAY RESULT IN THE CANCELLATION OF YOUR SURGERY PATIENT SIGNATURE_________________________________  NURSE SIGNATURE__________________________________  ________________________________________________________________________   Brad Mcdaniel  An incentive spirometer is a tool that can help keep your lungs clear and active. This tool measures how well you are filling your lungs with each breath. Taking long deep breaths may help reverse or decrease the chance of developing breathing (pulmonary) problems (especially infection)  following:  A long period of time when you are unable to move or be active. BEFORE THE PROCEDURE   If the spirometer includes an indicator to show your best effort, your nurse or respiratory therapist will set it to a desired goal.  If possible, sit up straight or lean slightly forward. Try not to slouch.  Hold the incentive spirometer in an upright position. INSTRUCTIONS FOR USE  1. Sit on the edge of your bed if possible, or sit up as far as you can in bed or on a chair. 2. Hold the incentive spirometer in an upright position. 3. Breathe out normally. 4. Place the mouthpiece in your mouth and seal your lips tightly around it. 5. Breathe in slowly and as deeply as possible, raising the piston or the ball toward the top of the column. 6. Hold your breath for 3-5 seconds or for as long as possible. Allow the piston or ball to fall to the bottom of the column. 7. Remove the mouthpiece from your mouth and breathe out normally. 8. Rest for a few seconds and repeat Steps 1 through 7 at least 10 times every 1-2 hours when you are awake. Take your time and take a few normal breaths between deep breaths. 9. The spirometer may include an indicator to show your best effort. Use the indicator as a goal to work toward during each repetition. 10. After each set of 10 deep breaths, practice coughing to be sure your lungs are clear. If you have an incision (the cut made at the time of surgery), support your incision when coughing by placing a pillow or rolled up towels firmly against it. Once you are able to get out of bed, walk around indoors and cough well. You may stop using the incentive spirometer when instructed by your caregiver.  RISKS AND COMPLICATIONS  Take your time so you do not get  dizzy or light-headed.  If you are in pain, you may need to take or ask for pain medication before doing incentive spirometry. It is harder to take a deep breath if you are having pain. AFTER USE  Rest and  breathe slowly and easily.  It can be helpful to keep track of a log of your progress. Your caregiver can provide you with a simple table to help with this. If you are using the spirometer at home, follow these instructions: [place] IF:   You are having difficultly using the spirometer.  You have trouble using the spirometer as often as instructed.  Your pain medication is not giving enough relief while using the spirometer.  You develop fever of 100.5 F (38.1 C) or higher. SEEK IMMEDIATE MEDICAL CARE IF:   You cough up bloody sputum that had not been present before.  You develop fever of 102 F (38.9 C) or greater.  You develop worsening pain at or near the incision site. MAKE SURE YOU:   Understand these instructions.  Will watch your condition.  Will get help right away if you are not doing well or get worse. Document Released: 04/18/2007 Document Revised: 02/28/2012 Document Reviewed: 06/19/2007 ExitCare Patient Information 2014 Exit[employer], [place].   ________________________________________________________________________  WHAT IS A BLOOD TRANSFUSION? Blood Transfusion Information  A transfusion is the replacement of blood or some of its parts. Blood is made up of multiple cells which provide different functions.  Red blood cells carry oxygen and are used for blood loss replacement.  White blood cells fight against infection.  Platelets control bleeding.  Plasma helps clot blood.  Other blood products are available for specialized needs, such as hemophilia or other clotting disorders. BEFORE THE TRANSFUSION  Who gives blood for transfusions?   Healthy volunteers who are fully evaluated to make sure their blood is safe. This is blood bank blood. Transfusion therapy is the safest it has ever been in the practice of medicine. Before blood is taken from a donor, a complete history is taken to make sure that person has no history of diseases nor engages in  risky social behavior (examples are intravenous drug use or sexual activity with multiple partners). The donor's travel history is screened to minimize risk of transmitting infections, such as malaria. The donated blood is tested for signs of infectious diseases, such as HIV and hepatitis. The blood is then tested to be sure it is compatible with you in order to minimize the chance of a transfusion reaction. If you or a relative donates blood, this is often done in anticipation of surgery and is not appropriate for emergency situations. It takes many days to process the donated blood. RISKS AND COMPLICATIONS Although transfusion therapy is very safe and saves many lives, the main dangers of transfusion include:   Getting an infectious disease.  Developing a transfusion reaction. This is an allergic reaction to something in the blood you were given. Every precaution is taken to prevent this. The decision to have a blood transfusion has been considered carefully by your caregiver before blood is given. Blood is not given unless the benefits outweigh the risks. AFTER THE TRANSFUSION  Right after receiving a blood transfusion, you will usually feel much better and more energetic. This is especially true if your red blood cells have gotten low (anemic). The transfusion raises the level of the red blood cells which carry oxygen, and this usually causes an energy increase.  The nurse administering the transfusion will  monitor you carefully for complications. HOME CARE INSTRUCTIONS  No special instructions are needed after a transfusion. You may find your energy is better. Speak with your caregiver about any limitations on activity for underlying diseases you may have. SEEK MEDICAL CARE IF:   Your condition is not improving after your transfusion.  You develop redness or irritation at the intravenous (IV) site. SEEK IMMEDIATE MEDICAL CARE IF:  Any of the following symptoms occur over the next 12  hours:  Shaking chills.  You have a temperature by mouth above 102 F (38.9 C), not controlled by medicine.  Chest, back, or muscle pain.  People around you feel you are not acting correctly or are confused.  Shortness of breath or difficulty breathing.  Dizziness and fainting.  You get a rash or develop hives.  You have a decrease in urine output.  Your urine turns a dark color or changes to pink, red, or brown. Any of the following symptoms occur over the next 10 days:  You have a temperature by mouth above 102 F (38.9 C), not controlled by medicine.  Shortness of breath.  Weakness after normal activity.  The white part of the eye turns yellow (jaundice).  You have a decrease in the amount of urine or are urinating less often.  Your urine turns a dark color or changes to pink, red, or brown. Document Released: 12/03/2000 Document Revised: 02/28/2012 Document Reviewed: 07/22/2008 [facility] Patient Information 2014 [place], [place].  _______________________________________________________________________

## 2017-05-17 NOTE — Progress Notes (Signed)
LOV/cardiology clearance Wyline Copas MD 04-08-17 chart/epic EKG 04-08-17 chart

## 2017-05-18 ENCOUNTER — Encounter (HOSPITAL_COMMUNITY): Payer: Self-pay

## 2017-05-18 ENCOUNTER — Encounter (HOSPITAL_COMMUNITY)
Admission: RE | Admit: 2017-05-18 | Discharge: 2017-05-18 | Disposition: A | Discharge status: Home / Self Care | Payer: PPO | Source: Ambulatory Visit | Attending: Orthopedic Surgery | Admitting: Orthopedic Surgery

## 2017-05-18 DIAGNOSIS — M1712 Unilateral primary osteoarthritis, left knee: Secondary | ICD-10-CM | POA: Diagnosis not present

## 2017-05-18 DIAGNOSIS — Z01818 Encounter for other preprocedural examination: Secondary | ICD-10-CM | POA: Insufficient documentation

## 2017-05-18 HISTORY — DX: Personal history of urinary calculi: Z87.442

## 2017-05-18 LAB — CBC
HCT: 44.6 % (ref 39.0–52.0)
Hemoglobin: 15 g/dL (ref 13.0–17.0)
MCH: 29.8 pg (ref 26.0–34.0)
MCHC: 33.6 g/dL (ref 30.0–36.0)
MCV: 88.5 fL (ref 78.0–100.0)
PLATELETS: 222 10*3/uL (ref 150–400)
RBC: 5.04 MIL/uL (ref 4.22–5.81)
RDW: 13.6 % (ref 11.5–15.5)
WBC: 7.4 10*3/uL (ref 4.0–10.5)

## 2017-05-18 LAB — COMPREHENSIVE METABOLIC PANEL
ALT: 16 U/L — ABNORMAL LOW (ref 17–63)
ANION GAP: 6 (ref 5–15)
AST: 19 U/L (ref 15–41)
Albumin: 4.2 g/dL (ref 3.5–5.0)
Alkaline Phosphatase: 49 U/L (ref 38–126)
BILIRUBIN TOTAL: 0.8 mg/dL (ref 0.3–1.2)
BUN: 23 mg/dL — ABNORMAL HIGH (ref 6–20)
CO2: 26 mmol/L (ref 22–32)
Calcium: 9.1 mg/dL (ref 8.9–10.3)
Chloride: 106 mmol/L (ref 101–111)
Creatinine, Ser: 1.1 mg/dL (ref 0.61–1.24)
GFR calc Af Amer: >60 mL/min (ref >60)
GFR calc non Af Amer: >60 mL/min (ref >60)
GLUCOSE: 93 mg/dL (ref 65–99)
POTASSIUM: 4.1 mmol/L (ref 3.5–5.1)
SODIUM: 138 mmol/L (ref 135–145)
Total Protein: 7.3 g/dL (ref 6.5–8.1)

## 2017-05-18 LAB — ABO/RH: ABO/RH(D): O POS

## 2017-05-18 LAB — SURGICAL PCR SCREEN
MRSA, PCR: NEGATIVE
Staphylococcus aureus: NEGATIVE

## 2017-05-18 LAB — PROTIME-INR
INR: 0.96
Prothrombin Time: 12.8 seconds (ref 11.4–15.2)

## 2017-05-18 LAB — APTT: aPTT: 29 seconds (ref 24–36)

## 2017-05-22 NOTE — Anesthesia Preprocedure Evaluation (Addendum)
Anesthesia Evaluation  Patient identified by MRN, date of birth, ID band Patient awake    Reviewed: Allergy & Precautions, H&P , NPO status , Patient's Chart, lab work & pertinent test results  Airway Mallampati: II  TM Distance: >3 FB Neck ROM: Full    Dental no notable dental hx. (+) Teeth Intact, Dental Advisory Given   Pulmonary neg pulmonary ROS,    Pulmonary exam normal breath sounds clear to auscultation       Cardiovascular Exercise Tolerance: Good negative cardio ROS   Rhythm:Regular Rate:Normal     Neuro/Psych negative neurological ROS  negative psych ROS   GI/Hepatic Neg liver ROS, GERD  Medicated and Controlled,  Endo/Other  negative endocrine ROS  Renal/GU negative Renal ROS  negative genitourinary   Musculoskeletal   Abdominal   Peds  Hematology negative hematology ROS (+)   Anesthesia Other Findings   Reproductive/Obstetrics negative OB ROS                            Anesthesia Physical Anesthesia Plan  ASA: II  Anesthesia Plan: Spinal   Post-op Pain Management:  Regional for Post-op pain   Induction: Intravenous  Airway Management Planned: Simple Face Mask  Additional Equipment:   Intra-op Plan:   Post-operative Plan:   Informed Consent: I have reviewed the patients History and Physical, chart, labs and discussed the procedure including the risks, benefits and alternatives for the proposed anesthesia with the patient or authorized representative who has indicated his/her understanding and acceptance.   Dental advisory given  Plan Discussed with: CRNA  Anesthesia Plan Comments:         Anesthesia Quick Evaluation

## 2017-05-23 ENCOUNTER — Encounter (HOSPITAL_COMMUNITY): Payer: Self-pay | Admitting: Certified Registered"

## 2017-05-23 ENCOUNTER — Encounter (HOSPITAL_COMMUNITY)
Admission: RE | Disposition: A | Discharge status: Home / Self Care | Payer: Self-pay | Source: Ambulatory Visit | Attending: Orthopedic Surgery

## 2017-05-23 ENCOUNTER — Inpatient Hospital Stay (HOSPITAL_COMMUNITY): Payer: PPO | Admitting: Anesthesiology

## 2017-05-23 ENCOUNTER — Inpatient Hospital Stay (HOSPITAL_COMMUNITY)
Admission: RE | Admit: 2017-05-23 | Discharge: 2017-05-24 | DRG: 470 | Disposition: A | Discharge status: Home / Self Care | Payer: PPO | Source: Ambulatory Visit | Attending: Orthopedic Surgery | Admitting: Orthopedic Surgery

## 2017-05-23 DIAGNOSIS — Z972 Presence of dental prosthetic device (complete) (partial): Secondary | ICD-10-CM | POA: Diagnosis not present

## 2017-05-23 DIAGNOSIS — M179 Osteoarthritis of knee, unspecified: Secondary | ICD-10-CM

## 2017-05-23 DIAGNOSIS — Z981 Arthrodesis status: Secondary | ICD-10-CM

## 2017-05-23 DIAGNOSIS — I10 Essential (primary) hypertension: Secondary | ICD-10-CM | POA: Diagnosis not present

## 2017-05-23 DIAGNOSIS — M25572 Pain in left ankle and joints of left foot: Secondary | ICD-10-CM | POA: Diagnosis not present

## 2017-05-23 DIAGNOSIS — N400 Enlarged prostate without lower urinary tract symptoms: Secondary | ICD-10-CM | POA: Diagnosis present

## 2017-05-23 DIAGNOSIS — G8918 Other acute postprocedural pain: Secondary | ICD-10-CM | POA: Diagnosis not present

## 2017-05-23 DIAGNOSIS — Z7982 Long term (current) use of aspirin: Secondary | ICD-10-CM

## 2017-05-23 DIAGNOSIS — Z8249 Family history of ischemic heart disease and other diseases of the circulatory system: Secondary | ICD-10-CM

## 2017-05-23 DIAGNOSIS — M25571 Pain in right ankle and joints of right foot: Secondary | ICD-10-CM | POA: Diagnosis present

## 2017-05-23 DIAGNOSIS — E7800 Pure hypercholesterolemia, unspecified: Secondary | ICD-10-CM | POA: Diagnosis not present

## 2017-05-23 DIAGNOSIS — K228 Other specified diseases of esophagus: Secondary | ICD-10-CM | POA: Diagnosis not present

## 2017-05-23 DIAGNOSIS — K219 Gastro-esophageal reflux disease without esophagitis: Secondary | ICD-10-CM | POA: Diagnosis present

## 2017-05-23 DIAGNOSIS — Z79899 Other long term (current) drug therapy: Secondary | ICD-10-CM

## 2017-05-23 DIAGNOSIS — Z96651 Presence of right artificial knee joint: Secondary | ICD-10-CM | POA: Diagnosis not present

## 2017-05-23 DIAGNOSIS — Z973 Presence of spectacles and contact lenses: Secondary | ICD-10-CM

## 2017-05-23 DIAGNOSIS — M1712 Unilateral primary osteoarthritis, left knee: Secondary | ICD-10-CM | POA: Diagnosis not present

## 2017-05-23 DIAGNOSIS — K2970 Gastritis, unspecified, without bleeding: Secondary | ICD-10-CM | POA: Diagnosis not present

## 2017-05-23 DIAGNOSIS — M25762 Osteophyte, left knee: Secondary | ICD-10-CM | POA: Diagnosis present

## 2017-05-23 DIAGNOSIS — M1710 Unilateral primary osteoarthritis, unspecified knee: Secondary | ICD-10-CM

## 2017-05-23 DIAGNOSIS — M25562 Pain in left knee: Secondary | ICD-10-CM | POA: Diagnosis not present

## 2017-05-23 HISTORY — PX: TOTAL KNEE ARTHROPLASTY: SHX125

## 2017-05-23 LAB — TYPE AND SCREEN
ABO/RH(D): O POS
Antibody Screen: NEGATIVE

## 2017-05-23 SURGERY — ARTHROPLASTY, KNEE, TOTAL
Anesthesia: Regional | Site: Knee | Laterality: Left

## 2017-05-23 MED ORDER — ACETAMINOPHEN 10 MG/ML IV SOLN
INTRAVENOUS | Status: AC
  Filled 2017-05-23: qty 100

## 2017-05-23 MED ORDER — DOCUSATE SODIUM 100 MG PO CAPS
100.0000 mg | ORAL_CAPSULE | Freq: Two times a day (BID) | ORAL | Status: DC
  Administered 2017-05-23 – 2017-05-24 (×2): 100 mg via ORAL
  Filled 2017-05-23 (×2): qty 1

## 2017-05-23 MED ORDER — LIDOCAINE 2% (20 MG/ML) 5 ML SYRINGE
INTRAMUSCULAR | Status: DC | PRN
  Administered 2017-05-23: 50 mg via INTRAVENOUS

## 2017-05-23 MED ORDER — CEFAZOLIN SODIUM-DEXTROSE 2-4 GM/100ML-% IV SOLN
2.0000 g | Freq: Four times a day (QID) | INTRAVENOUS | Status: AC
  Administered 2017-05-23 (×2): 2 g via INTRAVENOUS
  Filled 2017-05-23 (×2): qty 100

## 2017-05-23 MED ORDER — PHENYLEPHRINE 40 MCG/ML (10ML) SYRINGE FOR IV PUSH (FOR BLOOD PRESSURE SUPPORT)
PREFILLED_SYRINGE | INTRAVENOUS | Status: DC | PRN
  Administered 2017-05-23 (×2): 80 ug via INTRAVENOUS

## 2017-05-23 MED ORDER — METHOCARBAMOL 500 MG PO TABS: 500.0000 mg | ORAL_TABLET | Freq: Four times a day (QID) | ORAL | Status: DC | PRN

## 2017-05-23 MED ORDER — RIVAROXABAN 10 MG PO TABS
10.0000 mg | ORAL_TABLET | Freq: Every day | ORAL | Status: DC
  Administered 2017-05-24: 10 mg via ORAL
  Filled 2017-05-23: qty 1

## 2017-05-23 MED ORDER — ONDANSETRON HCL 4 MG PO TABS: 4.0000 mg | ORAL_TABLET | Freq: Four times a day (QID) | ORAL | Status: DC | PRN

## 2017-05-23 MED ORDER — DEXAMETHASONE SODIUM PHOSPHATE 10 MG/ML IJ SOLN
INTRAMUSCULAR | Status: AC
  Filled 2017-05-23: qty 1

## 2017-05-23 MED ORDER — LIDOCAINE 2% (20 MG/ML) 5 ML SYRINGE
INTRAMUSCULAR | Status: AC
  Filled 2017-05-23: qty 5

## 2017-05-23 MED ORDER — CEFAZOLIN SODIUM-DEXTROSE 2-4 GM/100ML-% IV SOLN
2.0000 g | INTRAVENOUS | Status: AC
  Administered 2017-05-23: 2 g via INTRAVENOUS

## 2017-05-23 MED ORDER — PRAVASTATIN SODIUM 20 MG PO TABS
40.0000 mg | ORAL_TABLET | Freq: Every day | ORAL | Status: DC
  Administered 2017-05-24: 10:00:00 40 mg via ORAL
  Filled 2017-05-23: qty 2

## 2017-05-23 MED ORDER — SODIUM CHLORIDE 0.9 % IJ SOLN
INTRAMUSCULAR | Status: DC | PRN
  Administered 2017-05-23: 60 mL

## 2017-05-23 MED ORDER — ONDANSETRON HCL 4 MG/2ML IJ SOLN: 4.0000 mg | Freq: Four times a day (QID) | INTRAMUSCULAR | Status: DC | PRN

## 2017-05-23 MED ORDER — PROPOFOL 500 MG/50ML IV EMUL
INTRAVENOUS | Status: DC | PRN
  Administered 2017-05-23: 75 ug/kg/min via INTRAVENOUS

## 2017-05-23 MED ORDER — ACETAMINOPHEN 10 MG/ML IV SOLN
1000.0000 mg | Freq: Once | INTRAVENOUS | Status: AC
  Administered 2017-05-23: 1000 mg via INTRAVENOUS

## 2017-05-23 MED ORDER — MIDAZOLAM HCL 2 MG/2ML IJ SOLN
2.0000 mg | Freq: Once | INTRAMUSCULAR | Status: AC
  Administered 2017-05-23: 2 mg via INTRAVENOUS

## 2017-05-23 MED ORDER — TAMSULOSIN HCL 0.4 MG PO CAPS
0.4000 mg | ORAL_CAPSULE | Freq: Every day | ORAL | Status: DC
  Administered 2017-05-24: 0.4 mg via ORAL
  Filled 2017-05-23: qty 1

## 2017-05-23 MED ORDER — DEXAMETHASONE SODIUM PHOSPHATE 10 MG/ML IJ SOLN
10.0000 mg | Freq: Once | INTRAMUSCULAR | Status: AC
  Administered 2017-05-23: 10 mg via INTRAVENOUS

## 2017-05-23 MED ORDER — POLYETHYLENE GLYCOL 3350 17 G PO PACK: 17.0000 g | PACK | Freq: Every day | ORAL | Status: DC | PRN

## 2017-05-23 MED ORDER — CEFAZOLIN SODIUM-DEXTROSE 2-4 GM/100ML-% IV SOLN
INTRAVENOUS | Status: AC
  Filled 2017-05-23: qty 100

## 2017-05-23 MED ORDER — ONDANSETRON HCL 4 MG/2ML IJ SOLN
INTRAMUSCULAR | Status: DC | PRN
  Administered 2017-05-23: 4 mg via INTRAVENOUS

## 2017-05-23 MED ORDER — MIDAZOLAM HCL 5 MG/5ML IJ SOLN
INTRAMUSCULAR | Status: DC | PRN
  Administered 2017-05-23: 2 mg via INTRAVENOUS

## 2017-05-23 MED ORDER — FENTANYL CITRATE (PF) 100 MCG/2ML IJ SOLN
INTRAMUSCULAR | Status: AC
  Filled 2017-05-23: qty 2

## 2017-05-23 MED ORDER — HYDROMORPHONE HCL 1 MG/ML IJ SOLN
0.5000 mg | INTRAMUSCULAR | Status: DC | PRN
  Administered 2017-05-23 – 2017-05-24 (×3): 1 mg via INTRAVENOUS
  Filled 2017-05-23 (×3): qty 1

## 2017-05-23 MED ORDER — BUPIVACAINE IN DEXTROSE 0.75-8.25 % IT SOLN
INTRATHECAL | Status: DC | PRN
  Administered 2017-05-23: 2 mL via INTRATHECAL

## 2017-05-23 MED ORDER — SODIUM CHLORIDE 0.9 % IJ SOLN
INTRAMUSCULAR | Status: AC
  Filled 2017-05-23: qty 10

## 2017-05-23 MED ORDER — PROPOFOL 10 MG/ML IV BOLUS
INTRAVENOUS | Status: AC
  Filled 2017-05-23: qty 20

## 2017-05-23 MED ORDER — LACTATED RINGERS IV SOLN
INTRAVENOUS | Status: DC
  Administered 2017-05-23 (×2): via INTRAVENOUS

## 2017-05-23 MED ORDER — METHOCARBAMOL 1000 MG/10ML IJ SOLN
500.0000 mg | Freq: Four times a day (QID) | INTRAVENOUS | Status: DC | PRN
  Filled 2017-05-23: qty 5

## 2017-05-23 MED ORDER — FINASTERIDE 5 MG PO TABS
5.0000 mg | ORAL_TABLET | Freq: Every day | ORAL | Status: DC
  Administered 2017-05-24: 5 mg via ORAL
  Filled 2017-05-23 (×2): qty 1

## 2017-05-23 MED ORDER — MENTHOL 3 MG MT LOZG: 1.0000 | LOZENGE | OROMUCOSAL | Status: DC | PRN

## 2017-05-23 MED ORDER — OXYCODONE HCL 5 MG PO TABS
5.0000 mg | ORAL_TABLET | ORAL | Status: DC | PRN
  Administered 2017-05-23 – 2017-05-24 (×5): 10 mg via ORAL
  Filled 2017-05-23 (×5): qty 2

## 2017-05-23 MED ORDER — OMEPRAZOLE MAGNESIUM 20 MG PO TBEC: 20.0000 mg | DELAYED_RELEASE_TABLET | Freq: Two times a day (BID) | ORAL | Status: DC

## 2017-05-23 MED ORDER — MORPHINE SULFATE (PF) 2 MG/ML IV SOLN: 1.0000 mg | INTRAVENOUS | Status: DC | PRN

## 2017-05-23 MED ORDER — EPHEDRINE SULFATE-NACL 50-0.9 MG/10ML-% IV SOSY
PREFILLED_SYRINGE | INTRAVENOUS | Status: DC | PRN
  Administered 2017-05-23 (×4): 10 mg via INTRAVENOUS

## 2017-05-23 MED ORDER — GABAPENTIN 300 MG PO CAPS
ORAL_CAPSULE | ORAL | Status: AC
  Filled 2017-05-23: qty 1

## 2017-05-23 MED ORDER — BISACODYL 10 MG RE SUPP: 10.0000 mg | Freq: Every day | RECTAL | Status: DC | PRN

## 2017-05-23 MED ORDER — GABAPENTIN 300 MG PO CAPS
300.0000 mg | ORAL_CAPSULE | Freq: Once | ORAL | Status: AC
  Administered 2017-05-23: 300 mg via ORAL

## 2017-05-23 MED ORDER — FENTANYL CITRATE (PF) 100 MCG/2ML IJ SOLN
INTRAMUSCULAR | Status: DC | PRN
  Administered 2017-05-23 (×2): 50 ug via INTRAVENOUS

## 2017-05-23 MED ORDER — METOCLOPRAMIDE HCL 5 MG/ML IJ SOLN: 5.0000 mg | Freq: Three times a day (TID) | INTRAMUSCULAR | Status: DC | PRN

## 2017-05-23 MED ORDER — DEXAMETHASONE SODIUM PHOSPHATE 10 MG/ML IJ SOLN
10.0000 mg | Freq: Once | INTRAMUSCULAR | Status: AC
  Administered 2017-05-24: 10 mg via INTRAVENOUS
  Filled 2017-05-23: qty 1

## 2017-05-23 MED ORDER — BUPIVACAINE LIPOSOME 1.3 % IJ SUSP
20.0000 mL | Freq: Once | INTRAMUSCULAR | Status: DC
  Filled 2017-05-23: qty 20

## 2017-05-23 MED ORDER — HYDROMORPHONE HCL 1 MG/ML IJ SOLN: 0.2500 mg | INTRAMUSCULAR | Status: DC | PRN

## 2017-05-23 MED ORDER — PHENOL 1.4 % MT LIQD
1.0000 | OROMUCOSAL | Status: DC | PRN
  Filled 2017-05-23: qty 177

## 2017-05-23 MED ORDER — ROPIVACAINE HCL 5 MG/ML IJ SOLN
INTRAMUSCULAR | Status: DC | PRN
  Administered 2017-05-23: 30 mL via PERINEURAL

## 2017-05-23 MED ORDER — ACETAMINOPHEN 500 MG PO TABS
1000.0000 mg | ORAL_TABLET | Freq: Four times a day (QID) | ORAL | Status: AC
  Administered 2017-05-23 (×3): 1000 mg via ORAL
  Filled 2017-05-23 (×4): qty 2

## 2017-05-23 MED ORDER — PANTOPRAZOLE SODIUM 40 MG PO TBEC
40.0000 mg | DELAYED_RELEASE_TABLET | Freq: Two times a day (BID) | ORAL | Status: DC
  Administered 2017-05-23 – 2017-05-24 (×2): 40 mg via ORAL
  Filled 2017-05-23 (×2): qty 1

## 2017-05-23 MED ORDER — ONDANSETRON HCL 4 MG/2ML IJ SOLN
INTRAMUSCULAR | Status: AC
  Filled 2017-05-23: qty 2

## 2017-05-23 MED ORDER — CHLORHEXIDINE GLUCONATE 4 % EX LIQD: 60.0000 mL | Freq: Once | CUTANEOUS | Status: DC

## 2017-05-23 MED ORDER — SODIUM CHLORIDE 0.9 % IJ SOLN
INTRAMUSCULAR | Status: AC
  Filled 2017-05-23: qty 50

## 2017-05-23 MED ORDER — MIDAZOLAM HCL 2 MG/2ML IJ SOLN
INTRAMUSCULAR | Status: AC
  Filled 2017-05-23: qty 2

## 2017-05-23 MED ORDER — FENTANYL CITRATE (PF) 100 MCG/2ML IJ SOLN
100.0000 ug | Freq: Once | INTRAMUSCULAR | Status: AC
  Administered 2017-05-23: 100 ug via INTRAVENOUS

## 2017-05-23 MED ORDER — BUPIVACAINE LIPOSOME 1.3 % IJ SUSP
INTRAMUSCULAR | Status: DC | PRN
  Administered 2017-05-23: 20 mL

## 2017-05-23 MED ORDER — SODIUM CHLORIDE 0.9 % IV SOLN
INTRAVENOUS | Status: DC
  Administered 2017-05-23 (×2): via INTRAVENOUS

## 2017-05-23 MED ORDER — SODIUM CHLORIDE 0.9 % IR SOLN
Status: DC | PRN
  Administered 2017-05-23: 1000 mL

## 2017-05-23 MED ORDER — PROPOFOL 10 MG/ML IV BOLUS: INTRAVENOUS | Status: DC | PRN

## 2017-05-23 MED ORDER — EPHEDRINE 5 MG/ML INJ
INTRAVENOUS | Status: AC
  Filled 2017-05-23: qty 10

## 2017-05-23 MED ORDER — ACETAMINOPHEN 650 MG RE SUPP: 650.0000 mg | Freq: Four times a day (QID) | RECTAL | Status: DC | PRN

## 2017-05-23 MED ORDER — FLEET ENEMA 7-19 GM/118ML RE ENEM: 1.0000 | ENEMA | Freq: Once | RECTAL | Status: DC | PRN

## 2017-05-23 MED ORDER — METOCLOPRAMIDE HCL 5 MG PO TABS: 5.0000 mg | ORAL_TABLET | Freq: Three times a day (TID) | ORAL | Status: DC | PRN

## 2017-05-23 MED ORDER — DIPHENHYDRAMINE HCL 12.5 MG/5ML PO ELIX: 12.5000 mg | ORAL_SOLUTION | ORAL | Status: DC | PRN

## 2017-05-23 MED ORDER — TRAMADOL HCL 50 MG PO TABS: 50.0000 mg | ORAL_TABLET | Freq: Four times a day (QID) | ORAL | Status: DC | PRN

## 2017-05-23 MED ORDER — ACETAMINOPHEN 325 MG PO TABS: 650.0000 mg | ORAL_TABLET | Freq: Four times a day (QID) | ORAL | Status: DC | PRN

## 2017-05-23 MED ORDER — TRANEXAMIC ACID 1000 MG/10ML IV SOLN
1000.0000 mg | INTRAVENOUS | Status: AC
  Administered 2017-05-23: 1000 mg via INTRAVENOUS
  Filled 2017-05-23: qty 1100

## 2017-05-23 SURGICAL SUPPLY — 51 items
BAG DECANTER FOR FLEXI CONT (MISCELLANEOUS) ×3 IMPLANT
BAG ZIPLOCK 12X15 (MISCELLANEOUS) ×3 IMPLANT
BANDAGE ACE 6X5 VEL STRL LF (GAUZE/BANDAGES/DRESSINGS) ×3 IMPLANT
BANDAGE ELASTIC 6 VELCRO ST LF (GAUZE/BANDAGES/DRESSINGS) ×3 IMPLANT
BLADE SAG 18X100X1.27 (BLADE) ×3 IMPLANT
BLADE SAW SGTL 11.0X1.19X90.0M (BLADE) ×3 IMPLANT
BOWL SMART MIX CTS (DISPOSABLE) ×3 IMPLANT
CAPT KNEE TOTAL 3 ATTUNE ×3 IMPLANT
CEMENT HV SMART SET (Cement) ×6 IMPLANT
CLOSURE WOUND 1/2 X4 (GAUZE/BANDAGES/DRESSINGS) ×1
COVER SURGICAL LIGHT HANDLE (MISCELLANEOUS) ×3 IMPLANT
CUFF TOURN SGL QUICK 34 (TOURNIQUET CUFF) ×2
CUFF TRNQT CYL 34X4X40X1 (TOURNIQUET CUFF) ×1 IMPLANT
DECANTER SPIKE VIAL GLASS SM (MISCELLANEOUS) ×3 IMPLANT
DRAPE U-SHAPE 47X51 STRL (DRAPES) ×3 IMPLANT
DRSG ADAPTIC 3X8 NADH LF (GAUZE/BANDAGES/DRESSINGS) ×3 IMPLANT
DRSG PAD ABDOMINAL 8X10 ST (GAUZE/BANDAGES/DRESSINGS) ×3 IMPLANT
DURAPREP 26ML APPLICATOR (WOUND CARE) ×3 IMPLANT
ELECT REM PT RETURN 15FT ADLT (MISCELLANEOUS) ×3 IMPLANT
EVACUATOR 1/8 PVC DRAIN (DRAIN) ×3 IMPLANT
GAUZE SPONGE 4X4 12PLY STRL (GAUZE/BANDAGES/DRESSINGS) ×3 IMPLANT
GLOVE BIO SURGEON STRL SZ7.5 (GLOVE) IMPLANT
GLOVE BIO SURGEON STRL SZ8 (GLOVE) ×3 IMPLANT
GLOVE BIOGEL PI IND STRL 6.5 (GLOVE) IMPLANT
GLOVE BIOGEL PI IND STRL 8 (GLOVE) ×1 IMPLANT
GLOVE BIOGEL PI INDICATOR 6.5 (GLOVE)
GLOVE BIOGEL PI INDICATOR 8 (GLOVE) ×2
GLOVE SURG SS PI 6.5 STRL IVOR (GLOVE) IMPLANT
GOWN STRL REUS W/TWL LRG LVL3 (GOWN DISPOSABLE) ×3 IMPLANT
GOWN STRL REUS W/TWL XL LVL3 (GOWN DISPOSABLE) IMPLANT
HANDPIECE INTERPULSE COAX TIP (DISPOSABLE) ×2
IMMOBILIZER KNEE 20 (SOFTGOODS) ×3
IMMOBILIZER KNEE 20 THIGH 36 (SOFTGOODS) ×1 IMPLANT
MANIFOLD NEPTUNE II (INSTRUMENTS) ×3 IMPLANT
NS IRRIG 1000ML POUR BTL (IV SOLUTION) ×3 IMPLANT
PACK TOTAL KNEE CUSTOM (KITS) ×3 IMPLANT
PAD ABD 8X10 STRL (GAUZE/BANDAGES/DRESSINGS) ×3 IMPLANT
PADDING CAST COTTON 6X4 STRL (CAST SUPPLIES) ×3 IMPLANT
POSITIONER SURGICAL ARM (MISCELLANEOUS) ×3 IMPLANT
SET HNDPC FAN SPRY TIP SCT (DISPOSABLE) ×1 IMPLANT
STRIP CLOSURE SKIN 1/2X4 (GAUZE/BANDAGES/DRESSINGS) ×2 IMPLANT
SUT MNCRL AB 4-0 PS2 18 (SUTURE) ×3 IMPLANT
SUT STRATAFIX 0 PDS 27 VIOLET (SUTURE) ×3
SUT VIC AB 2-0 CT1 27 (SUTURE) ×6
SUT VIC AB 2-0 CT1 TAPERPNT 27 (SUTURE) ×3 IMPLANT
SUTURE STRATFX 0 PDS 27 VIOLET (SUTURE) ×1 IMPLANT
SYR 50ML LL SCALE MARK (SYRINGE) IMPLANT
TRAY FOLEY W/METER SILVER 16FR (SET/KITS/TRAYS/PACK) ×3 IMPLANT
WATER STERILE IRR 1000ML POUR (IV SOLUTION) ×6 IMPLANT
WRAP KNEE MAXI GEL POST OP (GAUZE/BANDAGES/DRESSINGS) ×3 IMPLANT
YANKAUER SUCT BULB TIP 10FT TU (MISCELLANEOUS) ×3 IMPLANT

## 2017-05-23 NOTE — Anesthesia Procedure Notes (Signed)
Anesthesia Regional Block: Adductor canal block   Pre-Anesthetic Checklist: ,, timeout performed, Correct Patient, Correct Site, Correct Laterality, Correct Procedure, Correct Position, site marked, Risks and benefits discussed, pre-op evaluation,  At surgeon's request and post-op pain management  Laterality: Left  Prep: Maximum Sterile Barrier Precautions used, chloraprep       Needles:  Injection technique: Single-shot  Needle Type: Echogenic Stimulator Needle     Needle Length: 9cm  Needle Gauge: 21     Additional Needles:   Procedures: ultrasound guided,,,,,,,,  Narrative:  Start time: 05/23/2017 7:44 AM End time: 05/23/2017 7:54 AM Injection made incrementally with aspirations every 5 mL. Anesthesiologist: Roderic Palau  Additional Notes: 2% Lidocaine skin wheel.

## 2017-05-23 NOTE — Evaluation (Signed)
Physical Therapy Evaluation Patient Details Name: Brad Mcdaniel MRN: [id number] DOB: May 26, 1951 Today's Date: 05/23/2017   History of Present Illness  L TKA, h/o L ankle fusion  Clinical Impression  Pt is s/p TKA resulting in the deficits listed below (see PT Problem List). Pt ambulated 69' with RW, initiated TKA HEP. Excellent progress expected.  Pt will benefit from skilled PT to increase their independence and safety with mobility to allow discharge to the venue listed below.      Follow Up Recommendations Outpatient PT    Equipment Recommendations  Rolling walker with 5" wheels    Recommendations for Other Services OT consult     Precautions / Restrictions        Mobility  Bed Mobility Overal bed mobility: Needs Assistance Bed Mobility: Supine to Sit     Supine to sit: Supervision     General bed mobility comments: VCs for self assisting LLE with RLE  Transfers Overall transfer level: Needs assistance Equipment used: Rolling walker (2 wheeled) Transfers: Sit to/from Stand Sit to Stand: Min assist         General transfer comment: VCs hand placement, min A to rise  Ambulation/Gait Ambulation/Gait assistance: Min guard Ambulation Distance (Feet): 90 Feet Assistive device: Rolling walker (2 wheeled) Gait Pattern/deviations: Step-to pattern;Decreased step length - right;Decreased step length - left   Gait velocity interpretation: Below normal speed for age/gender General Gait Details: steady with RW, no LOB  Stairs            Wheelchair Mobility    Modified Rankin (Stroke Patients Only)       Balance Overall balance assessment: Modified Independent                                           Pertinent Vitals/Pain Pain Assessment: 0-10 Pain Score: 3  Pain Location: L knee  Pain Descriptors / Indicators: Sore Pain Intervention(s): Limited activity within patient's tolerance;Monitored during session;Premedicated before  session;Ice applied    Home Living Family/patient expects to be discharged to:: Private residence Living Arrangements: Spouse/significant other Available Help at Discharge: Family;Available 24 hours/day   Home Access: Ramped entrance     Home Layout: Two level;Able to live on main level with bedroom/bathroom Home Equipment: Walker - standard;Bedside commode;Shower seat      Prior Function Level of Independence: Independent               Hand Dominance        Extremity/Trunk Assessment   Upper Extremity Assessment Upper Extremity Assessment: Overall WFL for tasks assessed    Lower Extremity Assessment Lower Extremity Assessment: LLE deficits/detail LLE Deficits / Details: h/o L ankle fusion, knee AAROM 10-50* AAROM, SLR 3/5       Communication   Communication: No difficulties  Cognition Arousal/Alertness: Awake/alert Behavior During Therapy: WFL for tasks assessed/performed Overall Cognitive Status: Within Functional Limits for tasks assessed                                        General Comments      Exercises Total Joint Exercises Ankle Circles/Pumps: AROM;Both;10 reps Quad Sets: AROM;Both;10 reps Heel Slides: AROM;Left;10 reps   Assessment/Plan    PT Assessment Patient needs continued PT services  PT Problem List Decreased strength;Decreased range of motion;Decreased  activity tolerance;Decreased mobility;Pain;Decreased knowledge of use of DME       PT Treatment Interventions Gait training;DME instruction;Therapeutic activities;Therapeutic exercise;Stair training;Balance training;Patient/family education    PT Goals (Current goals can be found in the Care Plan section)  Acute Rehab PT Goals Patient Stated Goal: home remodeling PT Goal Formulation: With patient/family Time For Goal Achievement: 05/30/17 Potential to Achieve Goals: Good    Frequency 7X/week   Barriers to discharge        Co-evaluation                AM-PAC PT "6 Clicks" Daily Activity  Outcome Measure Difficulty turning over in bed (including adjusting bedclothes, sheets and blankets)?: A Little Difficulty moving from lying on back to sitting on the side of the bed? : A Little Difficulty sitting down on and standing up from a chair with arms (e.g., wheelchair, bedside commode, etc,.)?: A Little Help needed moving to and from a bed to chair (including a wheelchair)?: A Little Help needed walking in hospital room?: A Little Help needed climbing 3-5 steps with a railing? : A Lot 6 Click Score: 17    End of Session Equipment Utilized During Treatment: Gait belt Activity Tolerance: Patient tolerated treatment well Patient left: in chair;with call bell/phone within reach;with family/visitor present Nurse Communication: Mobility status PT Visit Diagnosis: Muscle weakness (generalized) (M62.81);Difficulty in walking, not elsewhere classified (R26.2);Pain Pain - Right/Left: Left Pain - part of body: Knee    Time: 4536-4680 PT Time Calculation (min) (ACUTE ONLY): 39 min   Charges:   PT Evaluation $PT Eval Low Complexity: 1 Procedure PT Treatments $Gait Training: 8-22 mins $Therapeutic Exercise: 8-22 mins   PT G Codes:          Philomena Doheny 05/23/2017, 4:50 PM [phone]

## 2017-05-23 NOTE — Anesthesia Procedure Notes (Signed)
Spinal  Patient location during procedure: OR End time: 05/23/2017 8:11 AM Staffing Resident/CRNA: Noralyn Pick D Performed: anesthesiologist and resident/CRNA  Preanesthetic Checklist Completed: patient identified, site marked, surgical consent, pre-op evaluation, timeout performed, IV checked, risks and benefits discussed and monitors and equipment checked Spinal Block Patient position: sitting Prep: Betadine Patient monitoring: heart rate, continuous pulse ox and blood pressure Approach: right paramedian Location: L3-4 Injection technique: single-shot Needle Needle type: Sprotte  Needle gauge: 24 G Needle length: 9 cm Assessment Sensory level: T6 Additional Notes Expiration date of kit checked and confirmed. Patient tolerated procedure well, without complications.

## 2017-05-23 NOTE — Op Note (Signed)
OPERATIVE REPORT-TOTAL KNEE ARTHROPLASTY   Pre-operative diagnosis- Osteoarthritis  Left knee(s)  Post-operative diagnosis- Osteoarthritis Left knee(s)  Procedure-  Left  Total Knee Arthroplasty  Surgeon- Dione Plover. Justine Dines, MD  Assistant- Arlee Muslim, PA-C   Anesthesia-  Adductor canal block and spinal  EBL-* No blood loss amount entered *   Drains Hemovac  Tourniquet time-  Total Tourniquet Time Documented: Thigh (Left) - 36 minutes Total: Thigh (Left) - 36 minutes     Complications- None  Condition-PACU - hemodynamically stable.   Brief Clinical Note   Brad Mcdaniel is a 66 y.o. year old male with end stage OA of his left knee with progressively worsening pain and dysfunction. He has constant pain, with activity and at rest and significant functional deficits with difficulties even with ADLs. He has had extensive non-op management including analgesics, injections of cortisone, and home exercise program, but remains in significant pain with significant dysfunction. Radiographs show bone on bone arthritis medial and patellofemoral. He presents now for left Total Knee Arthroplasty.     Procedure in detail---   The patient is brought into the operating room and positioned supine on the operating table. After successful administration of  Adductor canal block and spinal,   a tourniquet is placed high on the  Left thigh(s) and the lower extremity is prepped and draped in the usual sterile fashion. Time out is performed by the operating team and then the  Left lower extremity is wrapped in Esmarch, knee flexed and the tourniquet inflated to 300 mmHg.       A midline incision is made with a ten blade through the subcutaneous tissue to the level of the extensor mechanism. A fresh blade is used to make a medial parapatellar arthrotomy. Soft tissue over the proximal medial tibia is subperiosteally elevated to the joint line with a knife and into the semimembranosus bursa with a Cobb  elevator. Soft tissue over the proximal lateral tibia is elevated with attention being paid to avoiding the patellar tendon on the tibial tubercle. The patella is everted, knee flexed 90 degrees and the ACL and PCL are removed. Findings are bone on bone medial and patellofemoral with large global osteophytes.        The drill is used to create a starting hole in the distal femur and the canal is thoroughly irrigated with sterile saline to remove the fatty contents. The 5 degree Left  valgus alignment guide is placed into the femoral canal and the distal femoral cutting block is pinned to remove 9 mm off the distal femur. Resection is made with an oscillating saw.      The tibia is subluxed forward and the menisci are removed. The extramedullary alignment guide is placed referencing proximally at the medial aspect of the tibial tubercle and distally along the second metatarsal axis and tibial crest. The block is pinned to remove 67mm off the more deficient medial  side. Resection is made with an oscillating saw. Size 8is the most appropriate size for the tibia and the proximal tibia is prepared with the modular drill and keel punch for that size.      The femoral sizing guide is placed and size 8 is most appropriate. Rotation is marked off the epicondylar axis and confirmed by creating a rectangular flexion gap at 90 degrees. The size 8 cutting block is pinned in this rotation and the anterior, posterior and chamfer cuts are made with the oscillating saw. The intercondylar block is then placed and  that cut is made.      Trial size 8 tibial component, trial size 8 posterior stabilized femur and a 8  mm posterior stabilized rotating platform insert trial is placed. Full extension is achieved with excellent varus/valgus and anterior/posterior balance throughout full range of motion. The patella is everted and thickness measured to be 25  mm. Free hand resection is taken to 14 mm, a 41 template is placed, lug holes  are drilled, trial patella is placed, and it tracks normally. Osteophytes are removed off the posterior femur with the trial in place. All trials are removed and the cut bone surfaces prepared with pulsatile lavage. Cement is mixed and once ready for implantation, the size 8 tibial implant, size  8 posterior stabilized femoral component, and the size 41 patella are cemented in place and the patella is held with the clamp. The trial insert is placed and the knee held in full extension. The Exparel (20 ml mixed with 30 ml saline) is injected into the extensor mechanism, posterior capsule, medial and lateral gutters and subcutaneous tissues.  All extruded cement is removed and once the cement is hard the permanent 8 mm posterior stabilized rotating platform insert is placed into the tibial tray.      The wound is copiously irrigated with saline solution and the extensor mechanism closed over a hemovac drain with #1 V-loc suture. The tourniquet is released for a total tourniquet time of 36  minutes. Flexion against gravity is 140 degrees and the patella tracks normally. Subcutaneous tissue is closed with 2.0 vicryl and subcuticular with running 4.0 Monocryl. The incision is cleaned and dried and steri-strips and a bulky sterile dressing are applied. The limb is placed into a knee immobilizer and the patient is awakened and transported to recovery in stable condition.      Please note that a surgical assistant was a medical necessity for this procedure in order to perform it in a safe and expeditious manner. Surgical assistant was necessary to retract the ligaments and vital neurovascular structures to prevent injury to them and also necessary for proper positioning of the limb to allow for anatomic placement of the prosthesis.   Dione Plover Maressa Apollo, MD    05/23/2017, 9:13 AM

## 2017-05-23 NOTE — Transfer of Care (Signed)
Immediate Anesthesia Transfer of Care Note  Patient: Brad Mcdaniel  Procedure(s) Performed: Procedure(s): LEFT TOTAL KNEE ARTHROPLASTY (Left)  Patient Location: PACU  Anesthesia Type:Regional and Spinal  Level of Consciousness: awake, alert  and oriented  Airway & Oxygen Therapy: Patient Spontanous Breathing and Patient connected to nasal cannula oxygen  Post-op Assessment: Report given to RN and Post -op Vital signs reviewed and stable  Post vital signs: Reviewed and stable  Last Vitals:  Vitals:   05/23/17 0755 05/23/17 0756  BP:    Pulse: 61 63  Resp: 13 19  Temp:      Last Pain:  Vitals:   05/23/17 0632  TempSrc:   PainSc: 0-No pain      Patients Stated Pain Goal: 3 (37/35/78 9784)  Complications: No apparent anesthesia complications

## 2017-05-23 NOTE — Anesthesia Postprocedure Evaluation (Signed)
Anesthesia Post Note  Patient: Brad Mcdaniel  Procedure(s) Performed: Procedure(s) (LRB): LEFT TOTAL KNEE ARTHROPLASTY (Left)     Patient location during evaluation: PACU Anesthesia Type: Regional and Spinal Level of consciousness: awake and alert Pain management: pain level controlled Vital Signs Assessment: post-procedure vital signs reviewed and stable Respiratory status: spontaneous breathing and respiratory function stable Cardiovascular status: blood pressure returned to baseline and stable Postop Assessment: spinal receding Anesthetic complications: no    Last Vitals:  Vitals:   05/23/17 1100 05/23/17 1116  BP: 122/67 (!) 128/59  Pulse: 73 70  Resp: (!) 22 20  Temp: 36.3 C 37 C    Last Pain:  Vitals:   05/23/17 0951  TempSrc:   PainSc: 0-No pain                 Kida Digiulio,W. EDMOND

## 2017-05-23 NOTE — Anesthesia Postprocedure Evaluation (Signed)
Anesthesia Post Note  Patient: Brad Mcdaniel  Procedure(s) Performed: Procedure(s) (LRB): LEFT TOTAL KNEE ARTHROPLASTY (Left)     Patient location during evaluation: PACU Anesthesia Type: Spinal Level of consciousness: awake and alert Pain management: pain level controlled Vital Signs Assessment: post-procedure vital signs reviewed and stable Respiratory status: spontaneous breathing and respiratory function stable Cardiovascular status: blood pressure returned to baseline and stable Postop Assessment: spinal receding Anesthetic complications: no    Last Vitals:  Vitals:   05/23/17 1100 05/23/17 1116  BP: 122/67 (!) 128/59  Pulse: 73 70  Resp: (!) 22 20  Temp: 36.3 C 37 C    Last Pain:  Vitals:   05/23/17 0951  TempSrc:   PainSc: 0-No pain                 Nechuma Boven,W. EDMOND

## 2017-05-23 NOTE — Addendum Note (Signed)
Addendum  created 05/23/17 1133 by Roderic Palau, MD   Sign clinical note, SmartForm saved

## 2017-05-23 NOTE — Interval H&P Note (Signed)
History and Physical Interval Note:  05/23/2017 6:32 AM  Brad Mcdaniel  has presented today for surgery, with the diagnosis of Osteoarthritis Left Knee  The various methods of treatment have been discussed with the patient and family. After consideration of risks, benefits and other options for treatment, the patient has consented to  Procedure(s): LEFT TOTAL KNEE ARTHROPLASTY (Left) as a surgical intervention .  The patient's history has been reviewed, patient examined, no change in status, stable for surgery.  I have reviewed the patient's chart and labs.  Questions were answered to the patient's satisfaction.     Gearlean Alf

## 2017-05-23 NOTE — Progress Notes (Signed)
Assisted Dr. Edmond Fitzgerald with left, ultrasound guided, adductor canal block. Side rails up, monitors on throughout procedure. See vital signs in flow sheet. Tolerated Procedure well. 

## 2017-05-23 NOTE — H&P (View-Only) (Signed)
Brad Mcdaniel DOB: January 22, 1951 Married / Language: Brad Mcdaniel / Race: White Male Date of Admission:  05/23/2017 Left Knee Pain History of Present Illness The patient is a 66 year old male who comes in for a preoperative History and Physical. The patient is scheduled for a left total knee arthroplasty to be performed by Dr. Dione Plover. [name], MD at [facility] on 05-23-2017. The patient is a 66 year old male who presented with knee complaints. The patient reports left knee symptoms including: pain, swelling, locking, catching, giving way, weakness, soreness and grinding which began 7 year(s) ago in association with an established activity (pt. had left ankle sx). The patient describes their pain as sharp, dull, aching and throbbing.The patient feels that the symptoms are worsening. This problem has not been previously evaluated. Past treatment for this problem has included application of ice and application of heat. Symptoms are reported to be located in the left knee and include knee pain, swelling, stiffness, decreased range of motion, instability and difficulty bearing weight. The patient reports that symptoms radiate to the left thigh (muscle cramps). Symptoms are exacerbated by motion at the knee and weight bearing. Symptoms are relieved by rest. Current treatment includes use of a walker (cane) and nonsteroidal anti-inflammatory drugs (Ibuprofen). Note for "Knee pain": left ankle sx causing pt. to walk different. HX right uni knee by Dr. Smitty Cords.  Brad Mcdaniel was seen as a new evaluaiton of the left knee accompanied by his wife Brad Mcdaniel. He was referred over to the clininc by his brother, Brad Mcdaniel, who has had both knees replaced. Brad Mcdaniel has been having issues with the left knee for several years, 3-4, and has been progressive especially over the past couple of years. It has been prgressive in nature and has impacted his activity and mobility. 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  He is unable to work on ladders for any length of time and tries to avoids steps due to pain. He feels the leg is getting weaker with time. He will have cramps in both thighs at times. The knee has reached a point where is hurts every day and has to use Ibuprofen every for it.  The knee pops, grinds, but no buckling. He does have night pain and sedintary stiffness. He is unalbe to ride in the car for any length of time before he has to get out and walk around. The left knee is not as bad as the right knee was before he had the partial knee placed in it. The right knee partial was done about 7 years ago and this knee wore out because his left ankle was fused about 8 or so years ago and the right knee assumed the weight as his gait shifted following the ankle fusion.  Left knee is bothering him most times now. He said that he has become inactive because of the left knee. He has pain, but even worse than the pain is the dysfunction. He cannot do anything he desires because the knee will not hold up for him. He wants to give out. It is definitely limiting what he can and cannot do. He is at a stage now where he would like to get this knee fixed. He has had a previous unicompartmental replacement in the right knee. He has done fairly well with that. He does not really have much pain with it. He says that he is functioning a lot better now than he did before the  surgery. He is ready to get the left knee fixed at this time.  They have been treated conservatively in the past for the above stated problem and despite conservative measures, they continue to have progressive pain and severe functional limitations and dysfunction. They have failed non-operative management including home exercise, medications. It is felt that they would benefit from undergoing total joint replacement. Risks and benefits of the procedure have been discussed with the patient and they elect to proceed with surgery. There are no active  contraindications to surgery such as ongoing infection or rapidly progressive neurological disease.  Problem List/Past Medical Primary osteoarthritis of left knee (M17.12)  Chronic pain of both ankles (M25.571, M25.572)  Gastroesophageal Reflux Disease  High blood pressure  Hypercholesterolemia  Kidney Stone  Prostate Disease  Enlarged Prostate  Allergies No Known Drug Allergies   Family History Cancer  Maternal Grandmother. Hypertension  Mother. Kidney disease  Father. Osteoarthritis  Brother. Father  Deceased. Auto Accident Mother  Deceased. Alzheimer's  Social History Children  3 Current work status  retired Furniture conservator/restorer weekly; does running / walking Living situation  live with spouse Marital status  married Never consumed alcohol  01/07/2017: Never consumed alcohol No history of drug/alcohol rehab  Not under pain contract  Number of flights of stairs before winded  greater than 5 Tobacco / smoke exposure  01/07/2017: no Tobacco use  Never smoker. 01/07/2017  Medication History  Multiple Vitamin (1 (one) Oral) Active. Pravastatin Sodium (40MG  Tablet, Oral) Active. Lisinopril (5MG  Tablet, Oral) Active. Finasteride (5MG  Tablet, Oral) Active. Tamsulosin HCl (0.4MG  Capsule, Oral) Active. Ibuprofen (200MG  Capsule, 1 (one) Oral) Active. Aspirin (81MG  Tablet, 1 (one) Oral) Active. PriLOSEC (20MG  Capsule DR, Oral) Active.  Past Surgical History  Ankle Surgery  left Lithotripsy  Date: 2005. Left Knee Scope  Date: 06/2007. Left Ankle Fusion  Date: 11/2007. Right Knee Scope  Date: 05/2008. 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 Right Knee Unicompartmental Replacement  Date: 10/2009.  Review of Systems General Not Present- Chills, Fatigue, Fever, Memory Loss, Night Sweats, Weight Gain and Weight Loss. Skin Not Present- Eczema, Hives, Itching, Lesions and Rash. HEENT Not Present- Dentures, Double Vision, Headache, Hearing Loss,  Tinnitus and Visual Loss. Respiratory Not Present- Allergies, Chronic Cough, Coughing up blood, Shortness of breath at rest and Shortness of breath with exertion. Cardiovascular Not Present- Chest Pain, Difficulty Breathing Lying Down, Murmur, Palpitations, Racing/skipping heartbeats and Swelling. Gastrointestinal Present- Heartburn. Not Present- Abdominal Pain, Bloody Stool, Constipation, Diarrhea, Difficulty Swallowing, Jaundice, Loss of appetitie, Nausea and Vomiting. Male Genitourinary Not Present- Blood in Urine, Discharge, Flank Pain, Incontinence, Painful Urination, Urgency, Urinary frequency, Urinary Retention, Urinating at Night and Weak urinary stream. Musculoskeletal Present- Joint Pain. Not Present- Back Pain, Joint Swelling, Morning Stiffness, Muscle Pain, Muscle Weakness and Spasms. Neurological Not Present- Blackout spells, Difficulty with balance, Dizziness, Paralysis, Tremor and Weakness. Psychiatric Not Present- Insomnia.  Vitals Weight: 233 lb Height: 73in Weight was reported by patient. Height was reported by patient. Body Surface Area: 2.3 m Body Mass Index: 30.74 kg/m  Pulse: 64 (Regular)  BP: 128/66 (Sitting, Right Arm, Standard)   Physical Exam General Mental Status -Alert, cooperative and good historian. General Appearance-pleasant, Not in acute distress. Orientation-Oriented X3. Build & Nutrition-Well nourished and Well developed.  Head and Neck Head-normocephalic, atraumatic . Neck Global Assessment - supple, no bruit auscultated on the right, no bruit auscultated on the left.  Eye Vision-Wears corrective lenses(readers). Pupil - Bilateral-Regular and Round. Motion - Bilateral-EOMI.  ENMT Note:  partial upper denture plate   Chest and Lung Exam Auscultation Breath sounds - clear at anterior chest wall and clear at posterior chest wall. Adventitious sounds - No Adventitious sounds.  Cardiovascular Auscultation Rhythm -  Regular rate and rhythm. Heart Sounds - S1 WNL and S2 WNL. Murmurs & Other Heart Sounds - Auscultation of the heart reveals - No Murmurs.  Abdomen Palpation/Percussion Tenderness - Abdomen is non-tender to palpation. Rigidity (guarding) - Abdomen is soft. Auscultation Auscultation of the abdomen reveals - Bowel sounds normal.  Male Genitourinary Note: Not done, not pertinent to present illness   Musculoskeletal Note: He is a well-developed male, alert and oriented, in no apparent distress. Evaluation of his hips show normal range of motion with no discomfort. His right knee shows no swelling. Range about 0 to 125 with no tenderness, crepitus or instability. Left knee, no effusion, slight varus, range 5 to 115. There is marked crepitus on range of motion of the right knee. He is tender medial greater than lateral with no instability noted. He has a fused left ankle. Pulse, sensation and motor are intact both lower extremities.  His radiographs, AP and lateral of that left knee show bone on bone arthritis in the medial and patellofemoral compartments with tibial subluxation and varus deformity.   Assessment & Plan  Primary osteoarthritis of left knee (M17.12)  Note:Surgical Plans: Left Total Knee Replacement  Disposition: Home, Straight to outpatient at [facility] in [place], [place] to start on June 7th or June 8th  PCP: Dr. Wyline Copas - Patient has been seen preoperatively and felt to be stable for surgery.  IV TXA  Anesthesia Issues: None  Patient was instructed on what medications to stop prior to surgery.  Signed electronically by Brad Mcdaniel, III PA-C

## 2017-05-24 DIAGNOSIS — M1712 Unilateral primary osteoarthritis, left knee: Secondary | ICD-10-CM | POA: Diagnosis present

## 2017-05-24 DIAGNOSIS — M25562 Pain in left knee: Secondary | ICD-10-CM | POA: Diagnosis not present

## 2017-05-24 LAB — BASIC METABOLIC PANEL
ANION GAP: 4 — AB (ref 5–15)
BUN: 18 mg/dL (ref 6–20)
CO2: 26 mmol/L (ref 22–32)
Calcium: 8.5 mg/dL — ABNORMAL LOW (ref 8.9–10.3)
Chloride: 105 mmol/L (ref 101–111)
Creatinine, Ser: 0.85 mg/dL (ref 0.61–1.24)
GFR calc non Af Amer: >60 mL/min (ref >60)
Glucose, Bld: 143 mg/dL — ABNORMAL HIGH (ref 65–99)
POTASSIUM: 4.3 mmol/L (ref 3.5–5.1)
SODIUM: 135 mmol/L (ref 135–145)

## 2017-05-24 LAB — CBC
HEMATOCRIT: 37.5 % — AB (ref 39.0–52.0)
Hemoglobin: 12.4 g/dL — ABNORMAL LOW (ref 13.0–17.0)
MCH: 29 pg (ref 26.0–34.0)
MCHC: 33.1 g/dL (ref 30.0–36.0)
MCV: 87.8 fL (ref 78.0–100.0)
Platelets: 207 10*3/uL (ref 150–400)
RBC: 4.27 MIL/uL (ref 4.22–5.81)
RDW: 13.4 % (ref 11.5–15.5)
WBC: 15 10*3/uL — AB (ref 4.0–10.5)

## 2017-05-24 MED ORDER — OMEPRAZOLE 20 MG PO CPDR
20.0000 mg | DELAYED_RELEASE_CAPSULE | Freq: Two times a day (BID) | ORAL | Status: DC
  Filled 2017-05-24: qty 1

## 2017-05-24 MED ORDER — RIVAROXABAN 10 MG PO TABS: 10.0000 mg | ORAL_TABLET | Freq: Every day | ORAL | 0 refills | Status: DC

## 2017-05-24 MED ORDER — OXYCODONE HCL 5 MG PO TABS: 5.0000 mg | ORAL_TABLET | ORAL | 0 refills | Status: DC | PRN

## 2017-05-24 MED ORDER — TRAMADOL HCL 50 MG PO TABS: 50.0000 mg | ORAL_TABLET | Freq: Four times a day (QID) | ORAL | 0 refills | Status: DC | PRN

## 2017-05-24 MED ORDER — METHOCARBAMOL 500 MG PO TABS: 500.0000 mg | ORAL_TABLET | Freq: Four times a day (QID) | ORAL | 0 refills | Status: AC | PRN

## 2017-05-24 NOTE — Progress Notes (Signed)
Physical Therapy Treatment Patient Details Name: Brad Mcdaniel MRN: [id number] DOB: 1951/09/11 Today's Date: 05/24/2017    History of Present Illness L TKA, h/o L ankle fusion and R UKA    PT Comments    Pt has met PT goals and is ready to DC home from PT standpoint. He ambulated 375' with RW, completed stair training, and demonstrates good understanding of HEP. L knee flexion AAROM 95*, independent with SLR. Encouraged pt to walk in halls frequently.    Follow Up Recommendations  Outpatient PT     Equipment Recommendations  Rolling walker with 5" wheels    Recommendations for Other Services OT consult     Precautions / Restrictions Precautions Precautions: Knee Precaution Comments: reviewed no pillow under knee Restrictions Weight Bearing Restrictions: No Other Position/Activity Restrictions: WBAT    Mobility  Bed Mobility Overal bed mobility: Modified Independent Bed Mobility: Supine to Sit     Supine to sit: Modified independent (Device/Increase time)     General bed mobility comments: with rail  Transfers Overall transfer level: Modified independent Equipment used: Rolling walker (2 wheeled) Transfers: Sit to/from Stand Sit to Stand: Modified independent (Device/Increase time)         General transfer comment: with armrests  Ambulation/Gait Ambulation/Gait assistance: Modified independent (Device/Increase time) Ambulation Distance (Feet): 375 Feet Assistive device: Rolling walker (2 wheeled) Gait Pattern/deviations: Step-through pattern     General Gait Details: steady with RW, no LOB, good sequencing, good heel strike   Stairs Stairs: Yes   Stair Management: Two rails;Step to pattern;Forwards Number of Stairs: 5 General stair comments: good sequencing  Wheelchair Mobility    Modified Rankin (Stroke Patients Only)       Balance Overall balance assessment: Modified Independent                                           Cognition Arousal/Alertness: Awake/alert Behavior During Therapy: WFL for tasks assessed/performed Overall Cognitive Status: Within Functional Limits for tasks assessed                                        Exercises Total Joint Exercises Ankle Circles/Pumps: AROM;Both;10 reps Quad Sets: AROM;Both;10 reps Towel Squeeze: AROM;Both;5 reps;Supine Short Arc Quad: AROM;Left;10 reps;Supine Heel Slides: Left;10 reps;AAROM;Supine Straight Leg Raises: AROM;Left;10 reps;Supine Long Arc Quad: AROM;Left;5 reps;Seated Knee Flexion: AAROM;Left;10 reps;Seated Goniometric ROM: 5-95* AAROM L knee    General Comments        Pertinent Vitals/Pain Pain Score: 5  Pain Location: L knee  Pain Descriptors / Indicators: Sore Pain Intervention(s): Limited activity within patient's tolerance;Ice applied;Monitored during session;Premedicated before session    Home Living                      Prior Function            PT Goals (current goals can now be found in the care plan section) Acute Rehab PT Goals Patient Stated Goal: home remodeling PT Goal Formulation: With patient/family Time For Goal Achievement: 05/30/17 Potential to Achieve Goals: Good Progress towards PT goals: Goals met/education completed, patient discharged from PT    Frequency    7X/week      PT Plan Current plan remains appropriate    Co-evaluation  AM-PAC PT "6 Clicks" Daily Activity  Outcome Measure  Difficulty turning over in bed (including adjusting bedclothes, sheets and blankets)?: None Difficulty moving from lying on back to sitting on the side of the bed? : None Difficulty sitting down on and standing up from a chair with arms (e.g., wheelchair, bedside commode, etc,.)?: None Help needed moving to and from a bed to chair (including a wheelchair)?: None Help needed walking in hospital room?: None Help needed climbing 3-5 steps with a railing? : A Little 6 Click  Score: 23    End of Session Equipment Utilized During Treatment: Gait belt Activity Tolerance: Patient tolerated treatment well Patient left: in chair;with call bell/phone within reach;with family/visitor present Nurse Communication: Mobility status PT Visit Diagnosis: Muscle weakness (generalized) (M62.81);Difficulty in walking, not elsewhere classified (R26.2);Pain Pain - Right/Left: Left Pain - part of body: Knee     Time: 8850-2774 PT Time Calculation (min) (ACUTE ONLY): 48 min  Charges:  $Gait Training: 23-37 mins $Therapeutic Exercise: 8-22 mins                    G Codes:         Philomena Doheny 05/24/2017, 9:34 AM [phone]

## 2017-05-24 NOTE — Progress Notes (Signed)
Discharge planning, no HH needs identified. Patient plans to go straight to OP PT, already arranged. No DME needs. [phone]

## 2017-05-24 NOTE — Discharge Summary (Signed)
Physician Discharge Summary   Patient ID: Brad Mcdaniel MRN: [id number] DOB/AGE: [birth year] 66 y.o.  Admit date: 05/23/2017 Discharge date: 05/24/17  Primary Diagnosis:  Osteoarthritis  Left knee(s) Admission Diagnoses:  Past Medical History:  Diagnosis Date  . Esophageal dysphagia   . GERD (gastroesophageal reflux disease)   . H/O malignant gastrointestinal stromal tumor (GIST)   . H/O: HTN (hypertension)   . History of BPH   . History of esophageal stricture   . History of kidney stones   . 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   Weight as of this encounter: 106.1 kg (234 lb).  Procedure:  Procedure(s) (LRB): LEFT TOTAL KNEE ARTHROPLASTY (Left)   Consults: None  HPI: Brad Mcdaniel is a 66 y.o. year old male with end stage OA of his left knee with progressively worsening pain and dysfunction. He has constant pain, with activity and at rest and significant functional deficits with difficulties even with ADLs. He has had extensive non-op management including analgesics, injections of cortisone, and home exercise program, but remains in significant pain with significant dysfunction. Radiographs show bone on bone arthritis medial and patellofemoral. He presents now for left Total Knee Arthroplasty.   Laboratory Data: Admission on 05/23/2017  Component Date Value Ref Range Status  . WBC 05/24/2017 15.0* 4.0 - 10.5 K/uL Final  . RBC 05/24/2017 4.27  4.22 - 5.81 MIL/uL Final  . Hemoglobin 05/24/2017 12.4* 13.0 - 17.0 g/dL Final  . HCT 05/24/2017 37.5* 39.0 - 52.0 % Final  . MCV 05/24/2017 87.8  78.0 - 100.0 fL Final  . MCH 05/24/2017 29.0  26.0 - 34.0 pg Final  . MCHC 05/24/2017 33.1  30.0 - 36.0 g/dL Final  . RDW  05/24/2017 13.4  11.5 - 15.5 % Final  . Platelets 05/24/2017 207  150 - 400 K/uL Final  . Sodium 05/24/2017 135  135 - 145 mmol/L Final  . Potassium 05/24/2017 4.3  3.5 - 5.1 mmol/L Final  . Chloride 05/24/2017 105  101 - 111 mmol/L Final  . CO2 05/24/2017 26  22 - 32 mmol/L Final  . Glucose, Bld 05/24/2017 143* 65 - 99 mg/dL Final  . BUN 05/24/2017 18  6 - 20 mg/dL Final  . Creatinine, Ser 05/24/2017 0.85  0.61 - 1.24 mg/dL Final  . Calcium 05/24/2017 8.5* 8.9 - 10.3 mg/dL Final  . GFR calc non Af Amer 05/24/2017 >60  >60 mL/min Final  . GFR calc Af Amer 05/24/2017 >60  >60 mL/min Final   Comment: (NOTE) The eGFR has been calculated using the CKD EPI equation. This calculation has not been validated in all clinical situations. eGFR's persistently <60 mL/min signify possible Chronic Kidney Disease.   Georgiann Hahn gap 05/24/2017 4* 5 - 15 Final  Hospital Outpatient Visit on 05/18/2017  Component Date Value Ref Range Status  . aPTT 05/18/2017 29  24 - 36 seconds Final  . WBC 05/18/2017 7.4  4.0 - 10.5 K/uL Final  . RBC 05/18/2017 5.04  4.22 - 5.81 MIL/uL Final  . Hemoglobin 05/18/2017 15.0  13.0 - 17.0 g/dL Final  . HCT 05/18/2017 44.6  39.0 - 52.0 % Final  . MCV 05/18/2017 88.5  78.0 -  100.0 fL Final  . MCH 05/18/2017 29.8  26.0 - 34.0 pg Final  . MCHC 05/18/2017 33.6  30.0 - 36.0 g/dL Final  . RDW 05/18/2017 13.6  11.5 - 15.5 % Final  . Platelets 05/18/2017 222  150 - 400 K/uL Final  . Sodium 05/18/2017 138  135 - 145 mmol/L Final  . Potassium 05/18/2017 4.1  3.5 - 5.1 mmol/L Final  . Chloride 05/18/2017 106  101 - 111 mmol/L Final  . CO2 05/18/2017 26  22 - 32 mmol/L Final  . Glucose, Bld 05/18/2017 93  65 - 99 mg/dL Final  . BUN 05/18/2017 23* 6 - 20 mg/dL Final  . Creatinine, Ser 05/18/2017 1.10  0.61 - 1.24 mg/dL Final  . Calcium 05/18/2017 9.1  8.9 - 10.3 mg/dL Final  . Total Protein 05/18/2017 7.3  6.5 - 8.1 g/dL Final  . Albumin 05/18/2017 4.2  3.5 - 5.0 g/dL Final  . AST  05/18/2017 19  15 - 41 U/L Final  . ALT 05/18/2017 16* 17 - 63 U/L Final  . Alkaline Phosphatase 05/18/2017 49  38 - 126 U/L Final  . Total Bilirubin 05/18/2017 0.8  0.3 - 1.2 mg/dL Final  . GFR calc non Af Amer 05/18/2017 >60  >60 mL/min Final  . GFR calc Af Amer 05/18/2017 >60  >60 mL/min Final   Comment: (NOTE) The eGFR has been calculated using the CKD EPI equation. This calculation has not been validated in all clinical situations. eGFR's persistently <60 mL/min signify possible Chronic Kidney Disease.   . Anion gap 05/18/2017 6  5 - 15 Final  . Prothrombin Time 05/18/2017 12.8  11.4 - 15.2 seconds Final  . INR 05/18/2017 0.96   Final  . ABO/RH(D) 05/18/2017 O POS   Final  . Antibody Screen 05/18/2017 NEG   Final  . Sample Expiration 05/18/2017 05/26/2017   Final  . Extend sample reason 05/18/2017 NO TRANSFUSIONS OR PREGNANCY IN THE PAST 3 MONTHS   Final  . MRSA, PCR 05/18/2017 NEGATIVE  NEGATIVE Final  . Staphylococcus aureus 05/18/2017 NEGATIVE  NEGATIVE Final   Comment:        The Xpert SA Assay (FDA approved for NASAL specimens in patients over 89 years of age), is one component of a comprehensive surveillance program.  Test performance has been validated by [facility] for patients greater than or equal to 72 year old. It is not intended to diagnose infection nor to guide or monitor treatment.   . ABO/RH(D) 05/18/2017 O POS   Final     X-Rays:No results found.  EKG:No orders found for this or any previous visit.   Hospital Course: Brad Mcdaniel is a 66 y.o. who was admitted to [facility]. They were brought to the operating room on 05/23/2017 and underwent Procedure(s): LEFT TOTAL KNEE ARTHROPLASTY.  Patient tolerated the procedure well and was later transferred to the recovery room and then to the orthopaedic floor for postoperative care.  They were given PO and IV analgesics for pain control following their surgery.  They were given 24 hours of  postoperative antibiotics of  Anti-infectives    Start     Dose/Rate Route Frequency Ordered Stop   05/23/17 1400  ceFAZolin (ANCEF) IVPB 2g/100 mL premix     2 g 200 mL/hr over 30 Minutes Intravenous Every 6 hours 05/23/17 1306 05/23/17 2001   05/23/17 0617  ceFAZolin (ANCEF) 2-4 GM/100ML-% IVPB    Comments:  Domenic Moras   : cabinet override  05/23/17 0617 05/23/17 0813   05/23/17 0611  ceFAZolin (ANCEF) IVPB 2g/100 mL premix     2 g 200 mL/hr over 30 Minutes Intravenous On call to O.R. 05/23/17 2671 05/23/17 0843     and started on DVT prophylaxis in the form of Xarelto.   PT and OT were ordered for total joint protocol.  Discharge planning consulted to help with postop disposition and equipment needs.  Patient had a good night on the evening of surgery.  They started to get up OOB with therapy on day one. Hemovac drain was pulled without difficulty.  Patient was seen in rounds on POD 1 and was ready to go home.  Diet - Regular diet Follow up - in 2 weeks Activity - WBAT Disposition - Home Condition Upon Discharge - good D/C Meds - See DC Summary DVT Prophylaxis - Xarelto   Discharge Instructions    Call MD / Call 911    Complete by:  As directed    If you experience chest pain or shortness of breath, CALL 911 and be transported to the hospital emergency room.  If you develope a fever above 101 F, pus (white drainage) or increased drainage or redness at the wound, or calf pain, call your surgeon's office.   Change dressing    Complete by:  As directed    Change dressing daily with sterile 4 x 4 inch gauze dressing and apply TED hose. Do not submerge the incision under water.   Constipation Prevention    Complete by:  As directed    Drink plenty of fluids.  Prune juice may be helpful.  You may use a stool softener, such as Colace (over the counter) 100 mg twice a day.  Use MiraLax (over the counter) for constipation as needed.   Diet - low sodium heart healthy    Complete  by:  As directed    Discharge instructions    Complete by:  As directed    Take Xarelto for two and a half more weeks, then discontinue Xarelto. Once the patient has completed the Xarelto, they may resume the 81 mg Aspirin.   Pick up stool softner and laxative for home use following surgery while on pain medications. Do not submerge incision under water. Please use good hand washing techniques while changing dressing each day. May shower starting three days after surgery. Please use a clean towel to pat the incision dry following showers. Continue to use ice for pain and swelling after surgery. Do not use any lotions or creams on the incision until instructed by your surgeon.  Wear both TED hose on both legs during the day every day for three weeks, but may remove the TED hose at night at home.  Postoperative Constipation Protocol  Constipation - defined medically as fewer than three stools per week and severe constipation as less than one stool per week.  One of the most common issues patients have following surgery is constipation.  Even if you have a regular bowel pattern at home, your normal regimen is likely to be disrupted due to multiple reasons following surgery.  Combination of anesthesia, postoperative narcotics, change in appetite and fluid intake all can affect your bowels.  In order to avoid complications following surgery, here are some recommendations in order to help you during your recovery period.  Colace (docusate) - Pick up an over-the-counter form of Colace or another stool softener and take twice a day as long as you are requiring postoperative pain  medications.  Take with a full glass of water daily.  If you experience loose stools or diarrhea, hold the colace until you stool forms back up.  If your symptoms do not get better within 1 week or if they get worse, check with your doctor.  Dulcolax (bisacodyl) - Pick up over-the-counter and take as directed by the product  packaging as needed to assist with the movement of your bowels.  Take with a full glass of water.  Use this product as needed if not relieved by Colace only.   MiraLax (polyethylene glycol) - Pick up over-the-counter to have on hand.  MiraLax is a solution that will increase the amount of water in your bowels to assist with bowel movements.  Take as directed and can mix with a glass of water, juice, soda, coffee, or tea.  Take if you go more than two days without a movement. Do not use MiraLax more than once per day. Call your doctor if you are still constipated or irregular after using this medication for 7 days in a row.  If you continue to have problems with postoperative constipation, please contact the office for further assistance and recommendations.  If you experience "the worst abdominal pain ever" or develop nausea or vomiting, please contact the office immediatly for further recommendations for treatment.   Do not put a pillow under the knee. Place it under the heel.    Complete by:  As directed    Do not sit on low chairs, stoools or toilet seats, as it may be difficult to get up from low surfaces    Complete by:  As directed    Driving restrictions    Complete by:  As directed    No driving until released by the physician.   Increase activity slowly as tolerated    Complete by:  As directed    Lifting restrictions    Complete by:  As directed    No lifting until released by the physician.   Patient may shower    Complete by:  As directed    You may shower without a dressing once there is no drainage.  Do not wash over the wound.  If drainage remains, do not shower until drainage stops.   TED hose    Complete by:  As directed    Use stockings (TED hose) for 3 weeks on both leg(s).  You may remove them at night for sleeping.   Weight bearing as tolerated    Complete by:  As directed    Laterality:  left   Extremity:  Lower     Allergies as of 05/24/2017   No Known Allergies       Medication List    STOP taking these medications   aspirin EC 81 MG tablet   Ibuprofen 200 MG Caps   MULTI-VITAMINS Tabs   VITAMIN B-12 PO     TAKE these medications   finasteride 5 MG tablet Commonly known as:  PROSCAR Take 5 mg by mouth daily.   lisinopril 5 MG tablet Commonly known as:  PRINIVIL,ZESTRIL Take 5 mg by mouth daily.   methocarbamol 500 MG tablet Commonly known as:  ROBAXIN Take 1 tablet (500 mg total) by mouth every 6 (six) hours as needed for muscle spasms.   oxyCODONE 5 MG immediate release tablet Commonly known as:  Oxy IR/ROXICODONE Take 1-2 tablets (5-10 mg total) by mouth every 4 (four) hours as needed for moderate pain or severe pain.  pravastatin 40 MG tablet Commonly known as:  PRAVACHOL Take 40 mg by mouth daily.   PRILOSEC OTC 20 MG tablet Generic drug:  omeprazole Take 20 mg by mouth 2 (two) times daily.   rivaroxaban 10 MG Tabs tablet Commonly known as:  XARELTO Take 1 tablet (10 mg total) by mouth daily with breakfast. Take Xarelto for two and a half more weeks following discharge from the hospital, then discontinue Xarelto. Once the patient has completed the Xarelto, they may resume the 81 mg Aspirin.   tamsulosin 0.4 MG Caps capsule Commonly known as:  FLOMAX Take 0.4 mg by mouth daily after breakfast.   traMADol 50 MG tablet Commonly known as:  ULTRAM Take 1-2 tablets (50-100 mg total) by mouth every 6 (six) hours as needed for moderate pain.      Follow-up Information    Gaynelle Arabian, MD. Schedule an appointment as soon as possible for a visit on 06/07/2017.   Specialty:  Orthopedic Surgery Contact information: [address] [place] [address] [phone]           Signed: Arlee Muslim, PA-C Orthopaedic Surgery 05/24/2017, 9:05 AM

## 2017-05-24 NOTE — Progress Notes (Addendum)
Subjective: 1 Day Post-Op Procedure(s) (LRB): LEFT TOTAL KNEE ARTHROPLASTY (Left) Patient reports pain as mild.   Patient seen in rounds for Dr. Wynelle Link.  Patient found on rounds on day one doing very well.  Wanted to look into trying to go home. Patient is well, but has had some minor complaints of pain in the knee, requiring pain medications We will resume therapy today.  If they do well with therapy and meets all goals, then will allow home later this afternoon following therapy. Plan is to go Home after hospital stay.  Please note that patient has history of enlarged prostate and has had trouble with voiding after previous surgery.  Needs to make sure that he is voiding well is he is able to go home later today.  Objective: Vital signs in last 24 hours: Temp:  [97.3 F (36.3 C)-98.6 F (37 C)] 97.8 F (36.6 C) (06/05 0614) Pulse Rate:  [64-89] 64 (06/05 0614) Resp:  [16-22] 16 (06/05 0614) BP: (109-130)/(58-71) 116/64 (06/05 0614) SpO2:  [96 %-99 %] 97 % (06/05 0614)  Intake/Output from previous day:  Intake/Output Summary (Last 24 hours) at 05/24/17 0855 Last data filed at 05/24/17 0615  Gross per 24 hour  Intake          5873.33 ml  Output             2360 ml  Net          3513.33 ml    Intake/Output this shift: No intake/output data recorded.  Labs:  Recent Labs  05/24/17 0431  HGB 12.4*    Recent Labs  05/24/17 0431  WBC 15.0*  RBC 4.27  HCT 37.5*  PLT 207    Recent Labs  05/24/17 0431  NA 135  K 4.3  CL 105  CO2 26  BUN 18  CREATININE 0.85  GLUCOSE 143*  CALCIUM 8.5*   No results for input(s): LABPT, INR in the last 72 hours.  EXAM General - Patient is Alert, Appropriate and Oriented Extremity - Neurovascular intact Sensation intact distally Intact pulses distally Dorsiflexion/Plantar flexion intact Dressing - dressing C/D/I Motor Function - intact, moving foot and toes well on exam.  Hemovac pulled without difficulty.  Past  Medical History:  Diagnosis Date  . Esophageal dysphagia   . GERD (gastroesophageal reflux disease)   . H/O malignant gastrointestinal stromal tumor (GIST)   . H/O: HTN (hypertension)   . History of BPH   . History of esophageal stricture   . History of kidney stones   . 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   Weight as of this encounter: 106.1 kg (234 lb). Advance diet Up with therapy Discharge home - plan straight to outpatient therapy at [place] on Thursday   DVT Prophylaxis - Xarelto Weight-Bearing as tolerated to left leg D/C O2 and Pulse OX and try on Room Air  If meets goals and able to go home: Up with therapy Diet - Regular diet Follow up - in 2 weeks Activity - WBAT Disposition - Home Condition Upon Discharge - pending therapy D/C Meds - See DC Summary DVT Prophylaxis - Xarelto  Dian Situ  Dara Lords, PA-C Orthopaedic Surgery

## 2017-05-24 NOTE — Progress Notes (Signed)
OT Cancellation Note  Patient Details Name: Brad Mcdaniel MRN: [id number] DOB: Sep 25, 1951   Cancelled Treatment:    Reason Eval/Treat Not Completed: OT screened, no needs identified, will sign off.  Pt had uniknee and ankle fusion and feels comfortable with adls and bathroom transfers.  Reviewed knee precautions  Landynn Dupler 05/24/2017, 8:02 AM  Lesle Chris, OTR/L [phone] 05/24/2017

## 2017-05-24 NOTE — Discharge Instructions (Addendum)
° °Dr. Frank [name] °Total Joint Specialist °[facility] °[address] °[place],  [address] °[phone] ° °TOTAL KNEE REPLACEMENT POSTOPERATIVE DIRECTIONS ° °Knee Rehabilitation, Guidelines Following Surgery  °Results after knee surgery are often greatly improved when you follow the exercise, range of motion and muscle strengthening exercises prescribed by your doctor. Safety measures are also important to protect the knee from further injury. Any time any of these exercises cause you to have increased pain or swelling in your knee joint, decrease the amount until you are comfortable again and slowly increase them. If you have problems or questions, call your caregiver or physical therapist for advice.  ° °HOME CARE INSTRUCTIONS  °Remove items at home which could result in a fall. This includes throw rugs or furniture in walking pathways.  °· ICE to the affected knee every three hours for 30 minutes at a time and then as needed for pain and swelling.  Continue to use ice on the knee for pain and swelling from surgery. You may notice swelling that will progress down to the foot and ankle.  This is normal after surgery.  Elevate the leg when you are not up walking on it.   °· Continue to use the breathing machine which will help keep your temperature down.  It is common for your temperature to cycle up and down following surgery, especially at night when you are not up moving around and exerting yourself.  The breathing machine keeps your lungs expanded and your temperature down. °· Do not place pillow under knee, focus on keeping the knee straight while resting ° °DIET °You may resume your previous home diet once your are discharged from the hospital. ° °DRESSING / WOUND CARE / SHOWERING °You may shower 3 days after surgery, but keep the wounds dry during showering.  You may use an occlusive plastic wrap (Press'n Seal for example), NO SOAKING/SUBMERGING IN THE BATHTUB.  If the  bandage gets wet, change with a clean dry gauze.  If the incision gets wet, pat the wound dry with a clean towel. °You may start showering once you are discharged home but do not submerge the incision under water. Just pat the incision dry and apply a dry gauze dressing on daily. °Change the surgical dressing daily and reapply a dry dressing each time. ° °ACTIVITY °Walk with your walker as instructed. °Use walker as long as suggested by your caregivers. °Avoid periods of inactivity such as sitting longer than an hour when not asleep. This helps prevent blood clots.  °You may resume a sexual relationship in one month or when given the OK by your doctor.  °You may return to work once you are cleared by your doctor.  °Do not drive a car for 6 weeks or until released by you surgeon.  °Do not drive while taking narcotics. ° °WEIGHT BEARING °Weight bearing as tolerated with assist device (walker, cane, etc) as directed, use it as long as suggested by your surgeon or therapist, typically at least 4-6 weeks. ° °POSTOPERATIVE CONSTIPATION PROTOCOL °Constipation - defined medically as fewer than three stools per week and severe constipation as less than one stool per week. ° °One of the most common issues patients have following surgery is constipation.  Even if you have a regular bowel pattern at home, your normal regimen is likely to be disrupted due to multiple reasons following surgery.  Combination of anesthesia, postoperative narcotics, change in appetite and fluid intake all can affect your bowels.    In order to avoid complications following surgery, here are some recommendations in order to help you during your recovery period. ° °Colace (docusate) - Pick up an over-the-counter form of Colace or another stool softener and take twice a day as long as you are requiring postoperative pain medications.  Take with a full glass of water daily.  If you experience loose stools or diarrhea, hold the colace until you stool forms  back up.  If your symptoms do not get better within 1 week or if they get worse, check with your doctor. ° °Dulcolax (bisacodyl) - Pick up over-the-counter and take as directed by the product packaging as needed to assist with the movement of your bowels.  Take with a full glass of water.  Use this product as needed if not relieved by Colace only.  ° °MiraLax (polyethylene glycol) - Pick up over-the-counter to have on hand.  MiraLax is a solution that will increase the amount of water in your bowels to assist with bowel movements.  Take as directed and can mix with a glass of water, juice, soda, coffee, or tea.  Take if you go more than two days without a movement. °Do not use MiraLax more than once per day. Call your doctor if you are still constipated or irregular after using this medication for 7 days in a row. ° °If you continue to have problems with postoperative constipation, please contact the office for further assistance and recommendations.  If you experience "the worst abdominal pain ever" or develop nausea or vomiting, please contact the office immediatly for further recommendations for treatment. ° °ITCHING ° If you experience itching with your medications, try taking only a single pain pill, or even half a pain pill at a time.  You can also use Benadryl over the counter for itching or also to help with sleep.  ° °TED HOSE STOCKINGS °Wear the elastic stockings on both legs for three weeks following surgery during the day but you may remove then at night for sleeping. ° °MEDICATIONS °See your medication summary on the “After Visit Summary” that the nursing staff will review with you prior to discharge.  You may have some home medications which will be placed on hold until you complete the course of blood thinner medication.  It is important for you to complete the blood thinner medication as prescribed by your surgeon.  Continue your approved medications as instructed at time of  discharge. ° °PRECAUTIONS °If you experience chest pain or shortness of breath - call 911 immediately for transfer to the hospital emergency department.  °If you develop a fever greater that 101 F, purulent drainage from wound, increased redness or drainage from wound, foul odor from the wound/dressing, or calf pain - CONTACT YOUR SURGEON.   °                                                °FOLLOW-UP APPOINTMENTS °Make sure you keep all of your appointments after your operation with your surgeon and caregivers. You should call the office at the above phone number and make an appointment for approximately two weeks after the date of your surgery or on the date instructed by your surgeon outlined in the "After Visit Summary". ° ° °RANGE OF MOTION AND STRENGTHENING EXERCISES  °Rehabilitation of the knee is important following a knee injury or   an operation. After just a few days of immobilization, the muscles of the thigh which control the knee become weakened and shrink (atrophy). Knee exercises are designed to build up the tone and strength of the thigh muscles and to improve knee motion. Often times heat used for twenty to thirty minutes before working out will loosen up your tissues and help with improving the range of motion but do not use heat for the first two weeks following surgery. These exercises can be done on a training (exercise) mat, on the floor, on a table or on a bed. Use what ever works the best and is most comfortable for you Knee exercises include:  °Leg Lifts - While your knee is still immobilized in a splint or cast, you can do straight leg raises. Lift the leg to 60 degrees, hold for 3 sec, and slowly lower the leg. Repeat 10-20 times 2-3 times daily. Perform this exercise against resistance later as your knee gets better.  °Quad and Hamstring Sets - Tighten up the muscle on the front of the thigh (Quad) and hold for 5-10 sec. Repeat this 10-20 times hourly. Hamstring sets are done by pushing the  foot backward against an object and holding for 5-10 sec. Repeat as with quad sets.  °· Leg Slides: Lying on your back, slowly slide your foot toward your buttocks, bending your knee up off the floor (only go as far as is comfortable). Then slowly slide your foot back down until your leg is flat on the floor again. °· Angel Wings: Lying on your back spread your legs to the side as far apart as you can without causing discomfort.  °A rehabilitation program following serious knee injuries can speed recovery and prevent re-injury in the future due to weakened muscles. Contact your doctor or a physical therapist for more information on knee rehabilitation.  ° °IF YOU ARE TRANSFERRED TO A SKILLED REHAB FACILITY °If the patient is transferred to a skilled rehab facility following release from the hospital, a list of the current medications will be sent to the facility for the patient to continue.  When discharged from the skilled rehab facility, please have the facility set up the patient's Home Health Physical Therapy prior to being released. Also, the skilled facility will be responsible for providing the patient with their medications at time of release from the facility to include their pain medication, the muscle relaxants, and their blood thinner medication. If the patient is still at the rehab facility at time of the two week follow up appointment, the skilled rehab facility will also need to assist the patient in arranging follow up appointment in our office and any transportation needs. ° °MAKE SURE YOU:  °Understand these instructions.  °Get help right away if you are not doing well or get worse.  ° ° °Pick up stool softner and laxative for home use following surgery while on pain medications. °Do not submerge incision under water. °Please use good hand washing techniques while changing dressing each day. °May shower starting three days after surgery. °Please use a clean towel to pat the incision dry following  showers. °Continue to use ice for pain and swelling after surgery. °Do not use any lotions or creams on the incision until instructed by your surgeon. ° °Take Xarelto for two and a half more weeks following discharge from the hospital, then discontinue Xarelto. °Once the patient has completed the Xarelto, they may resume the 81 mg Aspirin. ° ° ° °Information on   my medicine - XARELTO® (Rivaroxaban) ° °Why was Xarelto® prescribed for you? °Xarelto® was prescribed for you to reduce the risk of blood clots forming after orthopedic surgery. The medical term for these abnormal blood clots is venous thromboembolism (VTE). ° °What do you need to know about xarelto® ? °Take your Xarelto® ONCE DAILY at the same time every day. °You may take it either with or without food. ° °If you have difficulty swallowing the tablet whole, you may crush it and mix in applesauce just prior to taking your dose. ° °Take Xarelto® exactly as prescribed by your doctor and DO NOT stop taking Xarelto® without talking to the doctor who prescribed the medication.  Stopping without other VTE prevention medication to take the place of Xarelto® may increase your risk of developing a clot. ° °After discharge, you should have regular check-up appointments with your healthcare provider that is prescribing your Xarelto®.   ° °What do you do if you miss a dose? °If you miss a dose, take it as soon as you remember on the same day then continue your regularly scheduled once daily regimen the next day. Do not take two doses of Xarelto® on the same day.  ° °Important Safety Information °A possible side effect of Xarelto® is bleeding. You should call your healthcare provider right away if you experience any of the following: °? Bleeding from an injury or your nose that does not stop. °? Unusual colored urine (red or dark brown) or unusual colored stools (red or black). °? Unusual bruising for unknown reasons. °? A serious fall or if you hit your head (even if  there is no bleeding). ° °Some medicines may interact with Xarelto® and might increase your risk of bleeding while on Xarelto®. To help avoid this, consult your healthcare provider or pharmacist prior to using any new prescription or non-prescription medications, including herbals, vitamins, non-steroidal anti-inflammatory drugs (NSAIDs) and supplements. ° °This website has more information on Xarelto®: www.xarelto.com. ° ° ° °

## 2017-05-26 DIAGNOSIS — M1712 Unilateral primary osteoarthritis, left knee: Secondary | ICD-10-CM | POA: Diagnosis not present

## 2017-05-30 DIAGNOSIS — M1712 Unilateral primary osteoarthritis, left knee: Secondary | ICD-10-CM | POA: Diagnosis not present

## 2017-06-01 DIAGNOSIS — M1712 Unilateral primary osteoarthritis, left knee: Secondary | ICD-10-CM | POA: Diagnosis not present

## 2017-06-03 DIAGNOSIS — M1712 Unilateral primary osteoarthritis, left knee: Secondary | ICD-10-CM | POA: Diagnosis not present

## 2017-06-08 DIAGNOSIS — M1712 Unilateral primary osteoarthritis, left knee: Secondary | ICD-10-CM | POA: Diagnosis not present

## 2017-06-10 DIAGNOSIS — M1712 Unilateral primary osteoarthritis, left knee: Secondary | ICD-10-CM | POA: Diagnosis not present

## 2017-06-20 NOTE — Progress Notes (Signed)
G-Codes for PT evaluation    05/23/17 1649  PT Time Calculation  PT Start Time (ACUTE ONLY) 1538  PT Stop Time (ACUTE ONLY) 1617  PT Time Calculation (min) (ACUTE ONLY) 39 min  PT G-Codes **NOT FOR INPATIENT CLASS**  Functional Assessment Tool Used AM-PAC 6 Clicks Basic Mobility;Clinical judgement  Functional Limitation Mobility: Walking and moving around  Mobility: Walking and Moving Around Current Status (B3010) CK  Mobility: Walking and Moving Around Goal Status (A0459) CJ  PT General Charges  $$ ACUTE PT VISIT 1 Procedure  PT Evaluation  $PT Eval Low Complexity 1 Procedure  PT Treatments  $Gait Training 8-22 mins  $Therapeutic Exercise 8-22 mins   Carmelia Bake, PT, DPT 06/20/2017 Pager: [phone]

## 2017-06-27 DIAGNOSIS — N132 Hydronephrosis with renal and ureteral calculous obstruction: Secondary | ICD-10-CM | POA: Diagnosis not present

## 2017-06-27 DIAGNOSIS — R351 Nocturia: Secondary | ICD-10-CM | POA: Diagnosis not present

## 2017-06-27 DIAGNOSIS — N3020 Other chronic cystitis without hematuria: Secondary | ICD-10-CM | POA: Diagnosis not present

## 2017-06-27 DIAGNOSIS — N401 Enlarged prostate with lower urinary tract symptoms: Secondary | ICD-10-CM | POA: Diagnosis not present

## 2017-06-28 DIAGNOSIS — N3020 Other chronic cystitis without hematuria: Secondary | ICD-10-CM | POA: Diagnosis not present

## 2017-06-28 DIAGNOSIS — N401 Enlarged prostate with lower urinary tract symptoms: Secondary | ICD-10-CM | POA: Diagnosis not present

## 2017-06-28 DIAGNOSIS — R351 Nocturia: Secondary | ICD-10-CM | POA: Diagnosis not present

## 2017-06-28 DIAGNOSIS — N132 Hydronephrosis with renal and ureteral calculous obstruction: Secondary | ICD-10-CM | POA: Diagnosis not present

## 2017-07-01 DIAGNOSIS — N201 Calculus of ureter: Secondary | ICD-10-CM | POA: Diagnosis not present

## 2017-07-01 DIAGNOSIS — N200 Calculus of kidney: Secondary | ICD-10-CM | POA: Diagnosis not present

## 2017-07-05 DIAGNOSIS — N3020 Other chronic cystitis without hematuria: Secondary | ICD-10-CM | POA: Diagnosis not present

## 2017-07-05 DIAGNOSIS — N1330 Unspecified hydronephrosis: Secondary | ICD-10-CM | POA: Diagnosis not present

## 2017-07-05 DIAGNOSIS — N201 Calculus of ureter: Secondary | ICD-10-CM | POA: Diagnosis not present

## 2017-07-12 DIAGNOSIS — Z96652 Presence of left artificial knee joint: Secondary | ICD-10-CM | POA: Diagnosis not present

## 2017-07-12 DIAGNOSIS — Z471 Aftercare following joint replacement surgery: Secondary | ICD-10-CM | POA: Diagnosis not present

## 2017-07-12 DIAGNOSIS — M1712 Unilateral primary osteoarthritis, left knee: Secondary | ICD-10-CM | POA: Diagnosis not present

## 2017-07-13 DIAGNOSIS — N3020 Other chronic cystitis without hematuria: Secondary | ICD-10-CM | POA: Diagnosis not present

## 2017-07-13 DIAGNOSIS — N1330 Unspecified hydronephrosis: Secondary | ICD-10-CM | POA: Diagnosis not present

## 2017-07-13 DIAGNOSIS — N201 Calculus of ureter: Secondary | ICD-10-CM | POA: Diagnosis not present

## 2017-08-23 DIAGNOSIS — N1330 Unspecified hydronephrosis: Secondary | ICD-10-CM | POA: Diagnosis not present

## 2017-08-23 DIAGNOSIS — N3020 Other chronic cystitis without hematuria: Secondary | ICD-10-CM | POA: Diagnosis not present

## 2017-08-23 DIAGNOSIS — N201 Calculus of ureter: Secondary | ICD-10-CM | POA: Diagnosis not present

## 2017-10-31 DIAGNOSIS — E8881 Metabolic syndrome: Secondary | ICD-10-CM | POA: Diagnosis not present

## 2017-10-31 DIAGNOSIS — Z1339 Encounter for screening examination for other mental health and behavioral disorders: Secondary | ICD-10-CM | POA: Diagnosis not present

## 2017-10-31 DIAGNOSIS — Z0000 Encounter for general adult medical examination without abnormal findings: Secondary | ICD-10-CM | POA: Diagnosis not present

## 2017-10-31 DIAGNOSIS — R351 Nocturia: Secondary | ICD-10-CM | POA: Diagnosis not present

## 2017-10-31 DIAGNOSIS — Z6828 Body mass index (BMI) 28.0-28.9, adult: Secondary | ICD-10-CM | POA: Diagnosis not present

## 2017-10-31 DIAGNOSIS — Z1159 Encounter for screening for other viral diseases: Secondary | ICD-10-CM | POA: Diagnosis not present

## 2017-10-31 DIAGNOSIS — Z79899 Other long term (current) drug therapy: Secondary | ICD-10-CM | POA: Diagnosis not present

## 2017-10-31 DIAGNOSIS — E785 Hyperlipidemia, unspecified: Secondary | ICD-10-CM | POA: Diagnosis not present

## 2017-10-31 DIAGNOSIS — K219 Gastro-esophageal reflux disease without esophagitis: Secondary | ICD-10-CM | POA: Diagnosis not present

## 2017-10-31 DIAGNOSIS — I10 Essential (primary) hypertension: Secondary | ICD-10-CM | POA: Diagnosis not present

## 2017-10-31 DIAGNOSIS — Z23 Encounter for immunization: Secondary | ICD-10-CM | POA: Diagnosis not present

## 2017-10-31 DIAGNOSIS — N401 Enlarged prostate with lower urinary tract symptoms: Secondary | ICD-10-CM | POA: Diagnosis not present

## 2017-11-20 DIAGNOSIS — M1990 Unspecified osteoarthritis, unspecified site: Secondary | ICD-10-CM | POA: Diagnosis not present

## 2017-11-20 DIAGNOSIS — M25531 Pain in right wrist: Secondary | ICD-10-CM | POA: Diagnosis not present

## 2018-01-02 DIAGNOSIS — M67431 Ganglion, right wrist: Secondary | ICD-10-CM | POA: Diagnosis not present

## 2018-01-14 IMAGING — CT CT ANKLE*R* W/O CM
1 series · 12 of 14 positions shown, 15 images · non-contrast
Comparison: None.

CLINICAL DATA: Chronic right ankle pain and swelling.

EXAM:
CT OF THE RIGHT ANKLE WITHOUT CONTRAST
TECHNIQUE: Multidetector CT imaging of the right ankle was performed according
to the standard protocol. Multiplanar CT image reconstructions were
also generated.

[Series 3: bone lower extremity · axial · 0.38mm/px · z∈[-245,-99]mm · 12 of 87 slices shown, 15 images]
[im 7/87  soft-tissue]
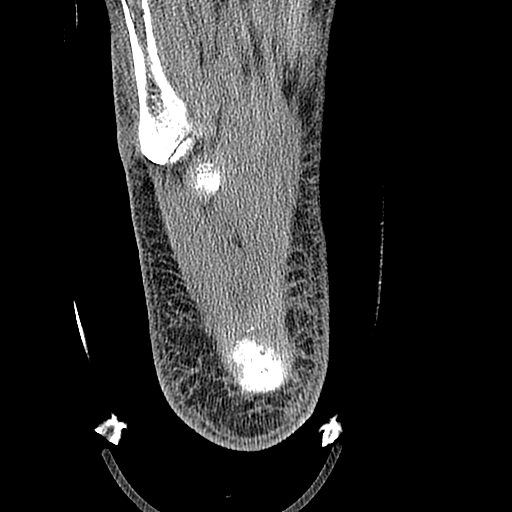
[im 7/87  bone]
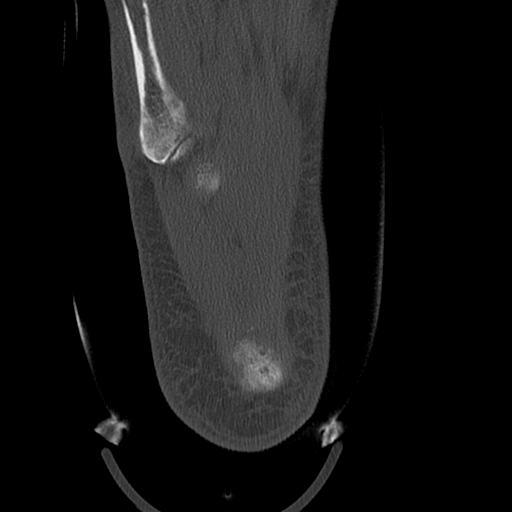
[im 14/87  bone]
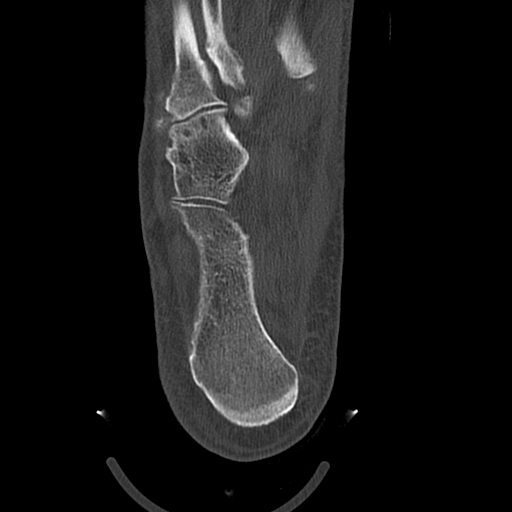
[im 20/87  bone]
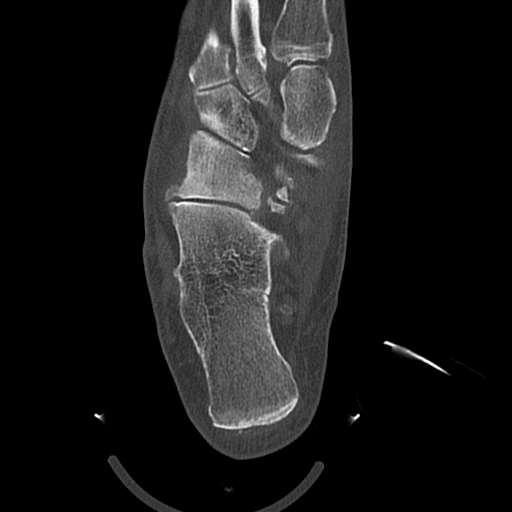
[im 27/87  bone]
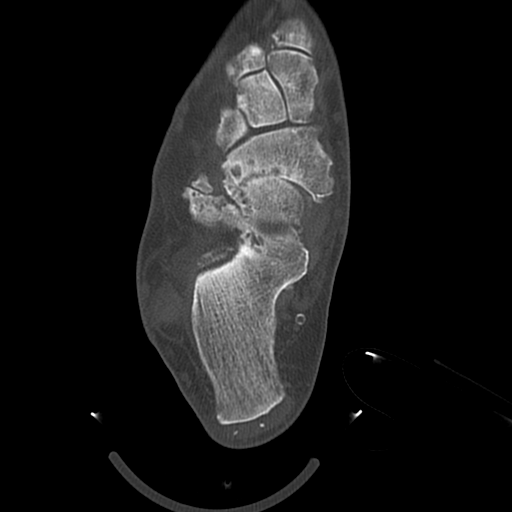
[im 34/87  soft-tissue]
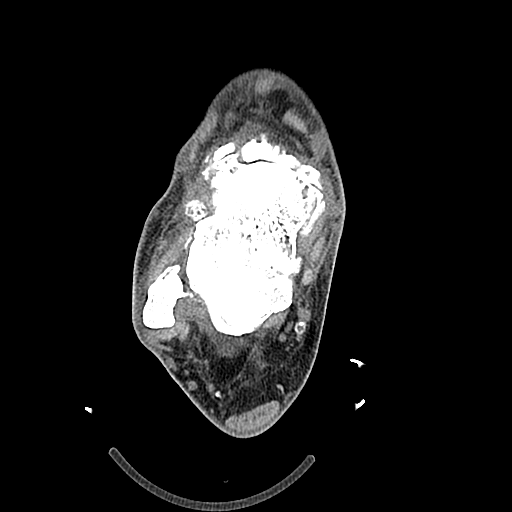
[im 34/87  bone]
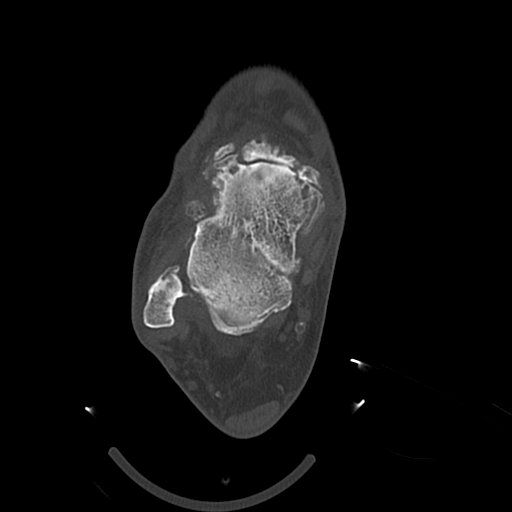
[im 40/87  bone]
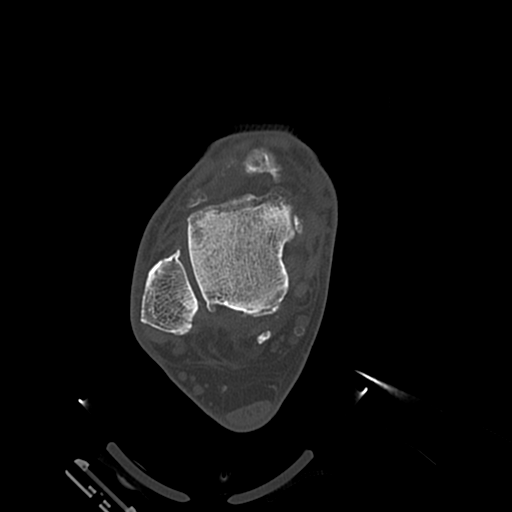
[im 47/87  bone]
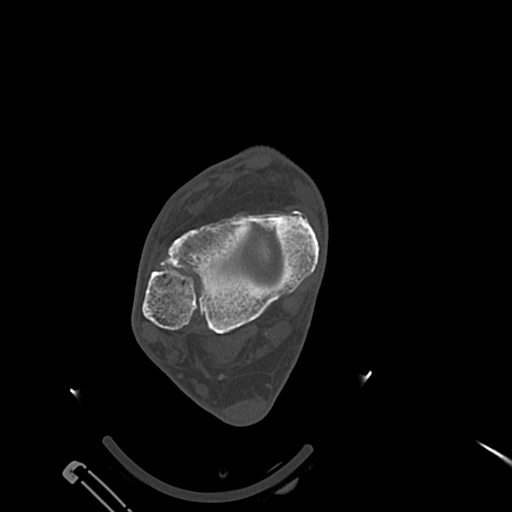
[im 53/87  bone]
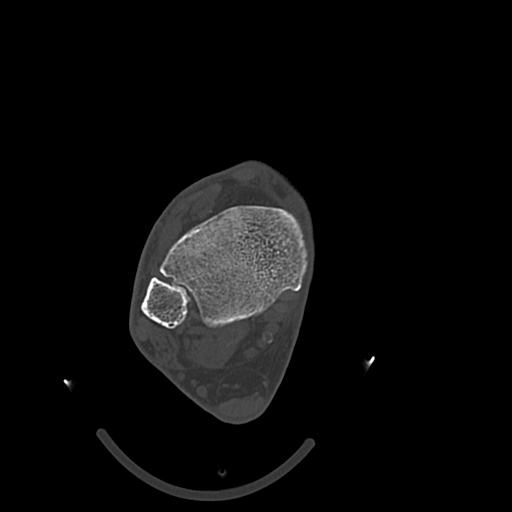
[im 60/87  soft-tissue]
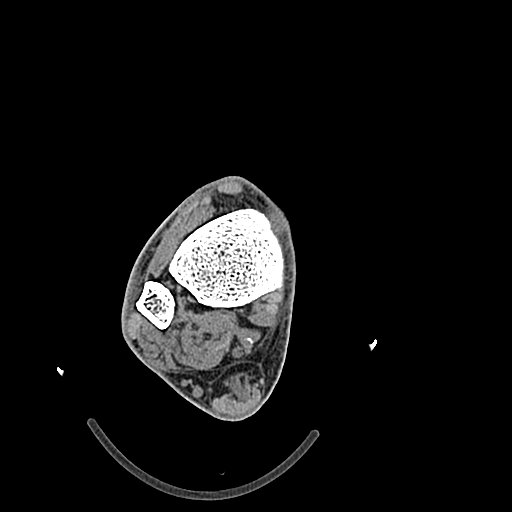
[im 60/87  bone]
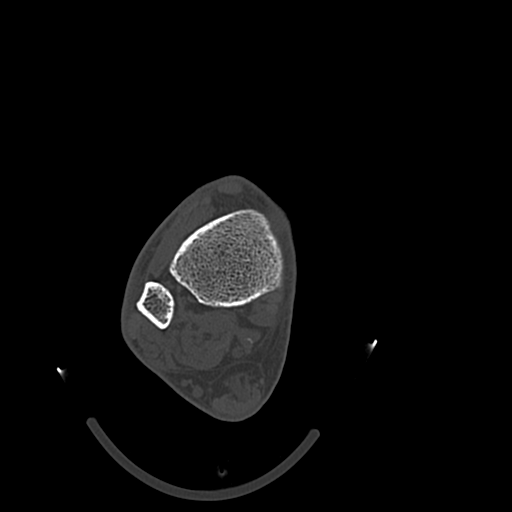
[im 67/87  bone]
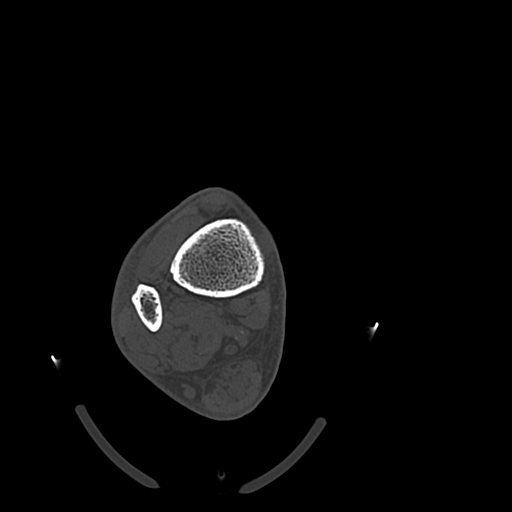
[im 73/87  bone]
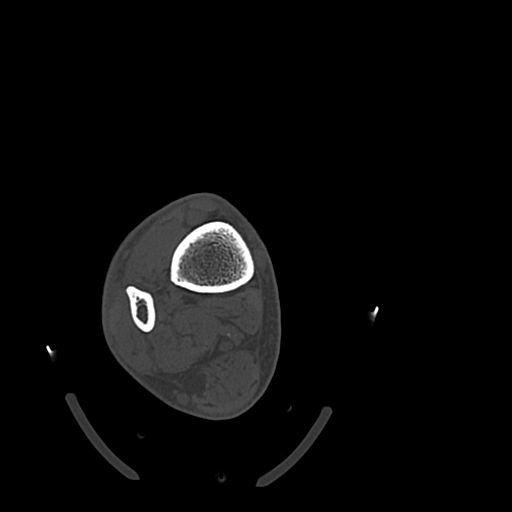
[im 80/87  bone]
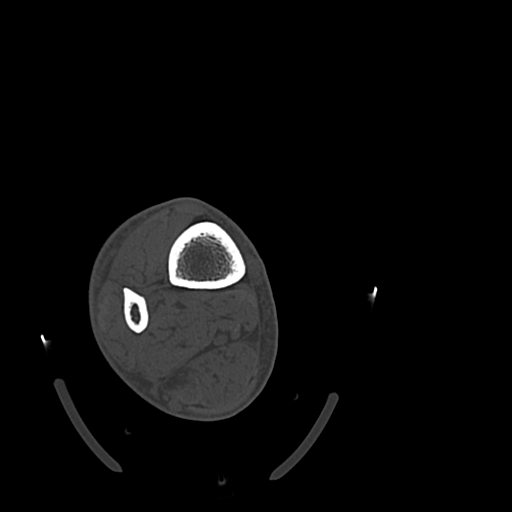

[12 of 14 positions shown; findings below may reference images not displayed]

FINDINGS: Bones/Joint/Cartilage

No fracture or dislocation. Normal alignment. No joint effusion.

Mild osteoarthritis of tibiotalar joint with small marginal
osteophytes. Subchondral lucency involving the medial and lateral
corners of the talar dome likely reflecting overlying cartilage
loss.

Severe osteoarthritis of the middle subtalar joint. Mild
osteoarthritis of the posterior subtalar joint. Severe
osteoarthritis of the talonavicular joint with subchondral cystic
changes and marginal osteophytosis. Moderate osteoarthritis of the
calcaneocuboid joint. Moderate osteoarthritis of the fourth
tarsometatarsal joint. Mild osteoarthritis of the third
tarsometatarsal joint. Mild osteoarthritis of the navicular-medial
cuneiform joint.

Small plantar calcaneal spur.

No lytic or sclerotic osseous lesion.

Relative pes planus.

Ligaments

Ligaments are suboptimally evaluated by CT.

Muscles and Tendons
Muscles are normal. Flexor, extensor, peroneal and Achilles tendons
are intact.

Soft tissue
No fluid collection or hematoma.  No soft tissue mass.
IMPRESSION: 1. Advanced osteoarthritis of the midfoot as described above.
2. Mild tibiotalar osteoarthritis.

## 2018-05-03 DIAGNOSIS — E785 Hyperlipidemia, unspecified: Secondary | ICD-10-CM | POA: Diagnosis not present

## 2018-05-05 DIAGNOSIS — Z96652 Presence of left artificial knee joint: Secondary | ICD-10-CM | POA: Diagnosis not present

## 2018-05-05 DIAGNOSIS — I10 Essential (primary) hypertension: Secondary | ICD-10-CM | POA: Diagnosis not present

## 2018-05-05 DIAGNOSIS — E785 Hyperlipidemia, unspecified: Secondary | ICD-10-CM | POA: Diagnosis not present

## 2018-05-05 DIAGNOSIS — M1990 Unspecified osteoarthritis, unspecified site: Secondary | ICD-10-CM | POA: Diagnosis not present

## 2018-05-25 DIAGNOSIS — Z96652 Presence of left artificial knee joint: Secondary | ICD-10-CM | POA: Diagnosis not present

## 2018-05-25 DIAGNOSIS — Z471 Aftercare following joint replacement surgery: Secondary | ICD-10-CM | POA: Diagnosis not present

## 2018-09-21 DIAGNOSIS — N318 Other neuromuscular dysfunction of bladder: Secondary | ICD-10-CM | POA: Diagnosis not present

## 2018-09-21 DIAGNOSIS — N3020 Other chronic cystitis without hematuria: Secondary | ICD-10-CM | POA: Diagnosis not present

## 2018-09-21 DIAGNOSIS — N401 Enlarged prostate with lower urinary tract symptoms: Secondary | ICD-10-CM | POA: Diagnosis not present

## 2018-09-21 DIAGNOSIS — N200 Calculus of kidney: Secondary | ICD-10-CM | POA: Diagnosis not present

## 2018-10-19 ENCOUNTER — Encounter: Payer: Self-pay | Admitting: Internal Medicine

## 2018-10-26 ENCOUNTER — Encounter: Payer: Self-pay | Admitting: Internal Medicine

## 2018-12-05 ENCOUNTER — Ambulatory Visit (AMBULATORY_SURGERY_CENTER): Payer: Self-pay | Admitting: *Deleted

## 2018-12-05 ENCOUNTER — Encounter: Payer: Self-pay | Admitting: Internal Medicine

## 2018-12-05 VITALS — Ht 72.0 in | Wt 202.0 lb

## 2018-12-05 DIAGNOSIS — Z1211 Encounter for screening for malignant neoplasm of colon: Secondary | ICD-10-CM

## 2018-12-05 MED ORDER — PEG-KCL-NACL-NASULF-NA ASC-C 140 G PO SOLR: 1.0000 | ORAL | 0 refills | Status: DC

## 2018-12-05 NOTE — Progress Notes (Signed)
No egg or soy allergy known to patient  No issues with past sedation with any surgeries  or procedures, no intubation problems  No diet pills per patient No home 02 use per patient  No blood thinners per patient  Pt denies issues with constipation  No A fib or A flutter  EMMI video sent to pt's e mail- pt declined  Plenvu sample due to insurance Lot [id number]   Exp 09/2019 as directed

## 2018-12-19 ENCOUNTER — Encounter: Payer: Self-pay | Admitting: Internal Medicine

## 2018-12-19 ENCOUNTER — Ambulatory Visit (AMBULATORY_SURGERY_CENTER): Payer: PPO | Admitting: Internal Medicine

## 2018-12-19 VITALS — BP 119/72 | HR 63 | Temp 96.0°F | Resp 12 | Ht 72.0 in | Wt 202.0 lb

## 2018-12-19 DIAGNOSIS — Z1211 Encounter for screening for malignant neoplasm of colon: Secondary | ICD-10-CM

## 2018-12-19 DIAGNOSIS — I10 Essential (primary) hypertension: Secondary | ICD-10-CM | POA: Diagnosis not present

## 2018-12-19 DIAGNOSIS — K219 Gastro-esophageal reflux disease without esophagitis: Secondary | ICD-10-CM | POA: Diagnosis not present

## 2018-12-19 MED ORDER — SODIUM CHLORIDE 0.9 % IV SOLN: 500.0000 mL | Freq: Once | INTRAVENOUS | Status: DC

## 2018-12-19 NOTE — Progress Notes (Signed)
Pt's states no medical or surgical changes since previsit or office visit. 

## 2018-12-19 NOTE — Op Note (Signed)
[facility] Patient Name: Brad Mcdaniel Procedure Date: 12/19/2018 9:51 AM MRN: [id number] Endoscopist: Docia Chuck. Henrene Pastor , MD Age: 67 Referring MD:  Date of Birth: [birth year] Gender: Male Account #: [id number] Procedure:                Colonoscopy Indications:              Screening for colorectal malignant neoplasm.                            Negative index exam 2009 Medicines:                Monitored Anesthesia Care Procedure:                Pre-Anesthesia Assessment:                           - Prior to the procedure, a History and Physical                            was performed, and patient medications and                            allergies were reviewed. The patient's tolerance of                            previous anesthesia was also reviewed. The risks                            and benefits of the procedure and the sedation                            options and risks were discussed with the patient.                            All questions were answered, and informed consent                            was obtained. Prior Anticoagulants: The patient has                            taken no previous anticoagulant or antiplatelet                            agents. ASA Grade Assessment: II - A patient with                            mild systemic disease. After reviewing the risks                            and benefits, the patient was deemed in                            satisfactory condition to undergo the procedure.  After obtaining informed consent, the colonoscope                            was passed under direct vision. Throughout the                            procedure, the patient's blood pressure, pulse, and                            oxygen saturations were monitored continuously. The                            Colonoscope was introduced through the anus and                            advanced to the the cecum, identified by                        appendiceal orifice and ileocecal valve. The                            ileocecal valve, appendiceal orifice, and rectum                            were photographed. The quality of the bowel                            preparation was excellent. The colonoscopy was                            performed without difficulty. The patient tolerated                            the procedure well. The bowel preparation used was                            SUPREP. Scope In: 10:14:32 AM Scope Out: 10:28:07 AM Scope Withdrawal Time: 0 hours 10 minutes 29 seconds  Total Procedure Duration: 0 hours 13 minutes 35 seconds  Findings:                 Internal hemorrhoids were found during retroflexion.                           The exam was otherwise without abnormality on                            direct and retroflexion views. Complications:            No immediate complications. Estimated blood loss:                            None. Estimated Blood Loss:     Estimated blood loss: none. Impression:               - Internal hemorrhoids.                           -  The examination was otherwise normal on direct                            and retroflexion views.                           - No specimens collected. Recommendation:           - Repeat colonoscopy in 10 years for screening                            purposes.                           - Patient has a contact number available for                            emergencies. The signs and symptoms of potential                            delayed complications were discussed with the                            patient. Return to normal activities tomorrow.                            Written discharge instructions were provided to the                            patient.                           - Resume previous diet.                           - Continue present medications. Docia Chuck. Henrene Pastor, MD 12/19/2018 10:32:36 AM This report  has been signed electronically.

## 2018-12-19 NOTE — Progress Notes (Signed)
PT taken to PACU. Monitors in place. VSS. Report given to RN. 

## 2018-12-19 NOTE — Patient Instructions (Signed)
  Handout given on hemorrhoids (internal hemorrhoids seen today).   YOU HAD AN ENDOSCOPIC PROCEDURE TODAY AT [facility]:   Refer to the procedure report that was given to you for any specific questions about what was found during the examination.  If the procedure report does not answer your questions, please call your gastroenterologist to clarify.  If you requested that your care partner not be given the details of your procedure findings, then the procedure report has been included in a sealed envelope for you to review at your convenience later.  YOU SHOULD EXPECT: Some feelings of bloating in the abdomen. Passage of more gas than usual.  Walking can help get rid of the air that was put into your GI tract during the procedure and reduce the bloating. If you had a lower endoscopy (such as a colonoscopy or flexible sigmoidoscopy) you may notice spotting of blood in your stool or on the toilet paper. If you underwent a bowel prep for your procedure, you may not have a normal bowel movement for a few days.  Please Note:  You might notice some irritation and congestion in your nose or some drainage.  This is from the oxygen used during your procedure.  There is no need for concern and it should clear up in a day or so.  SYMPTOMS TO REPORT IMMEDIATELY:   Following lower endoscopy (colonoscopy or flexible sigmoidoscopy):  Excessive amounts of blood in the stool  Significant tenderness or worsening of abdominal pains  Swelling of the abdomen that is new, acute  Fever of 100F or higher   For urgent or emergent issues, a gastroenterologist can be reached at any hour by calling [phone].   DIET:  We do recommend a small meal at first, but then you may proceed to your regular diet.  Drink plenty of fluids but you should avoid alcoholic beverages for 24 hours.  ACTIVITY:  You should plan to take it easy for the rest of today and you should NOT DRIVE or use heavy machinery until  tomorrow (because of the sedation medicines used during the test).    FOLLOW UP: Our staff will call the number listed on your records the next business day following your procedure to check on you and address any questions or concerns that you may have regarding the information given to you following your procedure. If we do not reach you, we will leave a message.  However, if you are feeling well and you are not experiencing any problems, there is no need to return our call.  We will assume that you have returned to your regular daily activities without incident.  If any biopsies were taken you will be contacted by phone or by letter within the next 1-3 weeks.  Please call us at [phone] if you have not heard about the biopsies in 3 weeks.    SIGNATURES/CONFIDENTIALITY: You and/or your care partner have signed paperwork which will be entered into your electronic medical record.  These signatures attest to the fact that that the information above on your After Visit Summary has been reviewed and is understood.  Full responsibility of the confidentiality of this discharge information lies with you and/or your care-partner.

## 2018-12-21 ENCOUNTER — Telehealth: Payer: Self-pay | Admitting: *Deleted

## 2018-12-21 NOTE — Telephone Encounter (Signed)
  Follow up Call-  Call back number 12/19/2018  Post procedure Call Back phone  # [phone]  Permission to leave phone message Yes  Some recent data might be hidden     Patient questions:  Do you have a fever, pain , or abdominal swelling? No. Pain Score  0 *  Have you tolerated food without any problems? Yes.    Have you been able to return to your normal activities? Yes.    Do you have any questions about your discharge instructions: Diet   No. Medications  No. Follow up visit  No.  Do you have questions or concerns about your Care? No.  Actions: * If pain score is 4 or above: No action needed, pain <4.

## 2019-04-20 ENCOUNTER — Encounter (HOSPITAL_COMMUNITY): Payer: Self-pay | Admitting: Emergency Medicine

## 2019-04-20 ENCOUNTER — Telehealth: Payer: Self-pay | Admitting: Internal Medicine

## 2019-04-20 ENCOUNTER — Emergency Department (HOSPITAL_COMMUNITY): Payer: PPO

## 2019-04-20 ENCOUNTER — Emergency Department (HOSPITAL_COMMUNITY)
Admission: EM | Admit: 2019-04-20 | Discharge: 2019-04-20 | Disposition: A | Discharge status: Home / Self Care | Payer: PPO | Attending: Emergency Medicine | Admitting: Emergency Medicine

## 2019-04-20 ENCOUNTER — Other Ambulatory Visit: Payer: Self-pay

## 2019-04-20 DIAGNOSIS — N200 Calculus of kidney: Secondary | ICD-10-CM | POA: Diagnosis not present

## 2019-04-20 DIAGNOSIS — Z79899 Other long term (current) drug therapy: Secondary | ICD-10-CM | POA: Insufficient documentation

## 2019-04-20 DIAGNOSIS — I10 Essential (primary) hypertension: Secondary | ICD-10-CM | POA: Insufficient documentation

## 2019-04-20 DIAGNOSIS — K921 Melena: Secondary | ICD-10-CM | POA: Diagnosis present

## 2019-04-20 DIAGNOSIS — K529 Noninfective gastroenteritis and colitis, unspecified: Secondary | ICD-10-CM

## 2019-04-20 DIAGNOSIS — R1032 Left lower quadrant pain: Secondary | ICD-10-CM | POA: Insufficient documentation

## 2019-04-20 DIAGNOSIS — Z87442 Personal history of urinary calculi: Secondary | ICD-10-CM | POA: Diagnosis not present

## 2019-04-20 LAB — COMPREHENSIVE METABOLIC PANEL
ALT: 29 U/L (ref 0–44)
AST: 25 U/L (ref 15–41)
Albumin: 3.7 g/dL (ref 3.5–5.0)
Alkaline Phosphatase: 63 U/L (ref 38–126)
Anion gap: 9 (ref 5–15)
BUN: 10 mg/dL (ref 8–23)
CO2: 25 mmol/L (ref 22–32)
Calcium: 9.1 mg/dL (ref 8.9–10.3)
Chloride: 103 mmol/L (ref 98–111)
Creatinine, Ser: 1.07 mg/dL (ref 0.61–1.24)
GFR calc Af Amer: >60 mL/min (ref >60)
GFR calc non Af Amer: >60 mL/min (ref >60)
Glucose, Bld: 111 mg/dL — ABNORMAL HIGH (ref 70–99)
Potassium: 3.9 mmol/L (ref 3.5–5.1)
Sodium: 137 mmol/L (ref 135–145)
Total Bilirubin: 0.7 mg/dL (ref 0.3–1.2)
Total Protein: 6.7 g/dL (ref 6.5–8.1)

## 2019-04-20 LAB — CBC
HCT: 48.7 % (ref 39.0–52.0)
Hemoglobin: 16.1 g/dL (ref 13.0–17.0)
MCH: 29.3 pg (ref 26.0–34.0)
MCHC: 33.1 g/dL (ref 30.0–36.0)
MCV: 88.5 fL (ref 80.0–100.0)
Platelets: 225 10*3/uL (ref 150–400)
RBC: 5.5 MIL/uL (ref 4.22–5.81)
RDW: 13.3 % (ref 11.5–15.5)
WBC: 14.1 10*3/uL — ABNORMAL HIGH (ref 4.0–10.5)
nRBC: 0 % (ref 0.0–0.2)

## 2019-04-20 LAB — LIPASE, BLOOD: Lipase: 30 U/L (ref 11–51)

## 2019-04-20 LAB — TYPE AND SCREEN
ABO/RH(D): O POS
Antibody Screen: NEGATIVE

## 2019-04-20 LAB — ABO/RH: ABO/RH(D): O POS

## 2019-04-20 MED ORDER — SODIUM CHLORIDE 0.9 % IV BOLUS
1000.0000 mL | Freq: Once | INTRAVENOUS | Status: AC
  Administered 2019-04-20: 1000 mL via INTRAVENOUS

## 2019-04-20 MED ORDER — CIPROFLOXACIN HCL 500 MG PO TABS: 500.0000 mg | ORAL_TABLET | Freq: Two times a day (BID) | ORAL | 0 refills | Status: AC

## 2019-04-20 MED ORDER — METRONIDAZOLE 500 MG PO TABS: 500.0000 mg | ORAL_TABLET | Freq: Two times a day (BID) | ORAL | 0 refills | Status: AC

## 2019-04-20 MED ORDER — IOPAMIDOL (ISOVUE-300) INJECTION 61%
100.0000 mL | Freq: Once | INTRAVENOUS | Status: AC | PRN
  Administered 2019-04-20: 100 mL via INTRAVENOUS

## 2019-04-20 NOTE — ED Notes (Signed)
Gave pt urinal to provide urine sample

## 2019-04-20 NOTE — ED Triage Notes (Signed)
Pt in with c/o GIB x 2 days - states stools are mostly BRB, some have been melena. Has stopped taking blood thinners for 6 mo's. C/o some weakness as well, denies any sob. Having low abdominal cramping x 2 days

## 2019-04-20 NOTE — ED Notes (Signed)
Pt verbalizes understanding of discharge instructions, reports having the opportunity to have questions answered

## 2019-04-20 NOTE — Telephone Encounter (Signed)
I spoke with the patient's wife.  He now has terrible crampy abdominal pain and some frank blood..  She is advised to take him to the ED now for evaluation.  Azucena Freed, PA notified.

## 2019-04-21 NOTE — ED Provider Notes (Signed)
[facility] Provider Note   CSN: [id number] Arrival date & time: 04/20/19  1006    History   Chief Complaint Chief Complaint  Patient presents with   Abdominal Pain   GI Bleeding    HPI Brad Mcdaniel is a 68 y.o. male.     HPI Patient is a 68 year old male presents the emergency department with complaints of bloody bowel movements over the past 2 days.  He has had some darker stools.  He is not on anticoagulants.  He has had some mild weakness and loose stools.  He has had cramping of his lower abdomen.  He reports left-sided abdominal pain x2 days.  No fevers or chills.  No history of diverticulosis or diverticulitis.  No recent changes medications.  No sick contacts.  No chest pain or shortness of breath.  No fevers.  Symptoms are mild in severity.  No lightheadedness with standing.  No syncope or near syncope.   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; currenty just affects vision mildly  ie cant see from far away;     Patient Active Problem List   Diagnosis Date Noted   Primary osteoarthritis of left knee 05/24/2017   OA (osteoarthritis) of knee 05/23/2017   GIST (gastrointestinal stromal tumor), non-malignant 05/19/2012   ESOPHAGEAL STRICTURE 11/11/2010   GERD 11/11/2010   DYSPHAGIA UNSPECIFIED 11/11/2010   ABDOMINAL PAIN-LUQ 11/11/2010   EPIGASTRIC MASS 12/09/2008   GASTRITIS 12/05/2008    Past Surgical History:  Procedure Laterality Date   ANKLE FUSION Left 2008   COLONOSCOPY     JOINT REPLACEMENT Right 2010   partial knee  replacement   LITHOTRIPSY     REMOVAL OF GASTROINTESTINAL STOMATIC  TUMOR OF STOMACH  01/2009   TONSILLECTOMY     TOTAL KNEE ARTHROPLASTY Left 05/23/2017   Procedure: LEFT TOTAL KNEE ARTHROPLASTY;  Surgeon: Gaynelle Arabian, MD;  Location: [facility];  Service: Orthopedics;  Laterality: Left;   UPPER GASTROINTESTINAL ENDOSCOPY          Home Medications    Prior to Admission medications   Medication Sig Start Date End Date Taking? Authorizing Provider  acetaminophen (TYLENOL) 500 MG tablet Take 500 mg by mouth every 6 (six) hours as needed for mild pain or headache.   Yes [provider]  aspirin (ASPIRIN 81) 81 MG chewable tablet Chew 81 mg by mouth daily.    Yes [provider]  finasteride (PROSCAR) 5 MG tablet Take 5 mg by mouth daily.   Yes [provider]  Multiple Vitamin (MULTI-VITAMIN DAILY PO) Take 1 tablet by mouth daily.    Yes [provider]  omeprazole (PRILOSEC OTC) 20 MG tablet Take 20 mg by mouth 2 (two) times daily.  12/08/15  Yes [provider]  pravastatin (PRAVACHOL) 40 MG tablet Take 40 mg by mouth daily.   Yes [provider]  tamsulosin (FLOMAX) 0.4 MG CAPS capsule Take 0.4 mg by mouth daily after breakfast.   Yes [provider]  ciprofloxacin (CIPRO) 500 MG tablet Take 1 tablet (500 mg total) by mouth 2 (two) times daily. 04/20/19   Jola Schmidt, MD  methocarbamol (ROBAXIN) 500 MG tablet Take 1 tablet (500 mg total) by mouth every 6 (six) hours as needed for muscle spasms. Patient not taking: Reported on 04/20/2019 05/24/17   Dara Lords, Alexzandrew L, PA-C  metroNIDAZOLE (FLAGYL) 500 MG tablet Take 1 tablet (500 mg total) by mouth 2 (two) times daily. 04/20/19   Jola Schmidt, MD    Family History Family History  Problem Relation Age of Onset   Colon cancer Neg Hx    Colon polyps Neg Hx    Esophageal cancer Neg Hx    Rectal cancer Neg Hx    Stomach cancer Neg Hx     Social History Social History    Tobacco Use   Smoking status: Never Smoker   Smokeless tobacco: Never Used  Substance Use Topics   Alcohol use: No   Drug use: No     Allergies   Patient has no known allergies.   Review of Systems Review of Systems  All other systems reviewed and are negative.    Physical Exam Updated Vital Signs BP (!) 142/74    Pulse (!) 58    Temp 97.9 F (36.6 C)    Resp 18    Wt 91.6 kg    SpO2 99%    BMI 27.39 kg/m   Physical Exam Vitals signs and nursing note reviewed.  Constitutional:      Appearance: He is well-developed.  HENT:     Head: Normocephalic and atraumatic.  Neck:     Musculoskeletal: Normal range of motion.  Cardiovascular:     Rate and Rhythm: Normal rate and regular rhythm.     Heart sounds: Normal heart sounds.  Pulmonary:     Effort: Pulmonary effort is normal. No respiratory distress.     Breath sounds: Normal breath sounds.  Abdominal:     General: There is no distension.     Palpations: Abdomen is soft.     Comments: Mild left-sided abdominal tenderness without guarding or rebound.  No peritoneal signs  Musculoskeletal: Normal range of motion.  Skin:    General: Skin is warm and dry.  Neurological:     Mental Status: He is alert and oriented to person, place, and time.  Psychiatric:        Judgment: Judgment normal.      ED Treatments / Results  Labs (all labs ordered are listed, but only abnormal results are displayed) Labs Reviewed  COMPREHENSIVE METABOLIC PANEL - Abnormal; Notable for the following components:      Result Value   Glucose, Bld 111 (*)    All other components within normal limits  CBC - Abnormal; Notable for the following components:   WBC 14.1 (*)    All other components within normal limits  LIPASE, BLOOD  TYPE AND SCREEN  ABO/RH    EKG EKG Interpretation  Date/Time:  Friday Apr 20 2019 10:14:02 EDT Ventricular Rate:  62 PR Interval:  158 QRS Duration: 76 QT Interval:  382 QTC Calculation: 387 R  Axis:   48 Text Interpretation:  Normal sinus rhythm Normal ECG No significant change was found Confirmed by Jola Schmidt [phone]) on 04/20/2019 11:42:46 AM   Radiology Ct Abdomen Pelvis W Contrast  Result Date: 04/20/2019 CLINICAL DATA:  Anterior abdominal pain with blood in the stool. EXAM: CT ABDOMEN AND PELVIS WITH CONTRAST TECHNIQUE: Multidetector CT imaging of  the abdomen and pelvis was performed using the standard protocol following bolus administration of intravenous contrast. CONTRAST:  138mL ISOVUE-300 IOPAMIDOL (ISOVUE-300) INJECTION 61% COMPARISON:  CT scan dated 03/13/2013 FINDINGS: Lower chest: Normal. Hepatobiliary: There are 2 tiny cysts in the anterior aspect of the dome of the right lobe of the liver. 1 tiny cyst in the left lobe of the liver. Liver parenchyma is otherwise normal. Biliary tree is normal. Pancreas: Unremarkable. No pancreatic ductal dilatation or surrounding inflammatory changes. Spleen: Normal in size without focal abnormality. Adrenals/Urinary Tract: Normal adrenal glands. Single tiny stone in the upper pole of the left kidney. 2 cm cyst on the lower pole of the left kidney, unchanged. Multiple small stones in the right kidney, progressed since the prior study. No hydronephrosis. Bladder is normal. Stomach/Bowel: There is marked edema of the mucosa of the descending colon from the level of the splenic flexure into the sigmoid colon. There is pericolonic soft tissue stranding with a small amount of fluid in the left pericolic gutter. No significant diverticular disease of the colon. Surgical staples are seen in the posterior aspect of the fundus of the stomach. The stomach otherwise appears normal. Small bowel and appendix appear normal. Vascular/Lymphatic: Aortic atherosclerosis. No enlarged abdominal or pelvic lymph nodes. Reproductive: Slight prominence of the prostate gland. Other: Small amount of ascites in the pelvis.  No hernias. Musculoskeletal: Bilateral pars defects  at L5. Arthritic changes of the facet joints at L5-S1 and to a lesser degree at L4-5. IMPRESSION: 1. Colitis involving the descending and proximal sigmoid portions of the colon. 2. Bilateral renal calculi, right more than left. Electronically Signed   By: Lorriane Shire M.D.   On: 04/20/2019 13:06    Procedures Procedures (including critical care time)  Medications Ordered in ED Medications  sodium chloride 0.9 % bolus 1,000 mL (0 mLs Intravenous Stopped 04/20/19 1405)  iopamidol (ISOVUE-300) 61 % injection 100 mL (100 mLs Intravenous Contrast Given 04/20/19 1238)     Initial Impression / Assessment and Plan / ED Course  I have reviewed the triage vital signs and the nursing notes.  Pertinent labs & imaging results that were available during my care of the patient were reviewed by me and considered in my medical decision making (see chart for details).        Acute colitis found on CT imaging.  Home with antibiotics.  No drop in his hemoglobin.  Vital signs are stable.  No indication for admission to the hospital.  He has a GI physician and we will follow-up with him as an outpatient.  He may benefit from outpatient colonoscopy and evaluation in the GI clinic.  He understands to return to the ER for new or worsening symptoms.  Symptoms of anemia provided to the patient as well.  All questions answered.  Home with ciprofloxacin and Flagyl.  Final Clinical Impressions(s) / ED Diagnoses   Final diagnoses:  Acute colitis    ED Discharge Orders         Ordered    ciprofloxacin (CIPRO) 500 MG tablet  2 times daily     04/20/19 1550    metroNIDAZOLE (FLAGYL) 500 MG tablet  2 times daily     04/20/19 1550           Jola Schmidt, MD 04/21/19 [phone]

## 2019-04-23 NOTE — Progress Notes (Signed)
Pt already scheduled for televisit with Dr. Henrene Pastor.

## 2019-04-25 ENCOUNTER — Encounter: Payer: Self-pay | Admitting: General Surgery

## 2019-04-26 ENCOUNTER — Ambulatory Visit (INDEPENDENT_AMBULATORY_CARE_PROVIDER_SITE_OTHER): Payer: PPO | Admitting: Internal Medicine

## 2019-04-26 ENCOUNTER — Encounter: Payer: Self-pay | Admitting: Internal Medicine

## 2019-04-26 ENCOUNTER — Other Ambulatory Visit: Payer: Self-pay

## 2019-04-26 VITALS — Ht 73.0 in | Wt 195.0 lb

## 2019-04-26 DIAGNOSIS — K559 Vascular disorder of intestine, unspecified: Secondary | ICD-10-CM

## 2019-04-26 DIAGNOSIS — R1084 Generalized abdominal pain: Secondary | ICD-10-CM | POA: Diagnosis not present

## 2019-04-26 DIAGNOSIS — R935 Abnormal findings on diagnostic imaging of other abdominal regions, including retroperitoneum: Secondary | ICD-10-CM | POA: Diagnosis not present

## 2019-04-26 DIAGNOSIS — K625 Hemorrhage of anus and rectum: Secondary | ICD-10-CM

## 2019-04-26 NOTE — Progress Notes (Signed)
HISTORY OF PRESENT ILLNESS:  Brad Mcdaniel is a 68 y.o. 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  He presents today at the request of the emergency room for evaluation of recent problems with abdominal pain and rectal bleeding.  I last saw the patient December 19, 2018 for routine screening colonoscopy.  Examination was normal.  Follow-up in 10 years recommended.  He states that he was in his usual state of health until 4 weeks ago when he began to develop intermittent problems with diarrhea.  Not unusual for him to have diarrhea but this seemed to be a bit more.  He adjusted some medications and his diarrhea resolved.  However, on April 19, 2019 developed severe abdominal pain followed by rectal bleeding.  This led him to the emergency room for evaluation on Apr 20, 2019.  Blood work was remarkable for a white blood cell count of 14.1.  His vital signs are stable.  He was afebrile.  He was noted to have mild left-sided abdominal tenderness without guarding or rebound.  CT scan of the abdomen and pelvis revealed segmental colitis involving the descending and proximal sigmoid colon.  He was sent home with ciprofloxacin and metronidazole.  After 3 days, his pain resolved.  He tells me that he is currently feeling well with more normal bowel movements.  He does mention that his antibiotics seem to make him feel a bit dizzy.  He is accompanied by his wife.  No active GI complaints at this time.  REVIEW OF SYSTEMS:  All non-GI ROS negative unless otherwise stated in the HPI except for arthritis  Past Medical History:  Diagnosis Date  . Arthritis   . Chronic kidney disease    kidney stone  . Esophageal dysphagia   . GERD (gastroesophageal reflux disease)   . H/O malignant gastrointestinal stromal tumor (GIST)   . H/O: HTN (hypertension)   . History of BPH   . History of esophageal stricture   . History  of kidney stones   . Hyperlipidemia   . Hypertension    past hx   . 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 ANKLE FUSION Left 2008  . COLONOSCOPY    . JOINT REPLACEMENT Right 2010   partial knee replacement  . LITHOTRIPSY    . REMOVAL OF GASTROINTESTINAL STOMATIC  TUMOR OF STOMACH  01/2009  . TONSILLECTOMY    . TOTAL KNEE ARTHROPLASTY Left 05/23/2017   Procedure: LEFT TOTAL KNEE ARTHROPLASTY;  Surgeon: Gaynelle Arabian, MD;  Location: [facility];  Service: Orthopedics;  Laterality: Left;  . UPPER GASTROINTESTINAL ENDOSCOPY      Social History Brad Mcdaniel  reports that he has never smoked. He has never used smokeless tobacco. He reports that he does not drink alcohol or use drugs.  family history includes Alzheimer's disease in his mother.  No Known Allergies     PHYSICAL EXAMINATION: No physical examination with telehealth visit   ASSESSMENT:  1.  Acute colitis most consistent with ischemic colitis.  Bacterial colitis less likely.  Risk factor was dehydration from issues with diarrhea the previous weeks.  Now well without symptoms 2.  Normal colonoscopy December 19, 2018 3.  Remote history of gastric GIST tumor status post resection 2010  PLAN:  1.  Discussion today on ischemic colitis as well as bacterial colitis 2.  Discontinue antibiotics 3.  Routine colonoscopy 2029.  Interval follow-up as needed This telemedicine visit during the corona virus pandemic was initiated by the patient and consented for by the patient.  He was in his home with his wife and I was in my office.  He understands it may be an associated professional charge for this service

## 2019-04-26 NOTE — Patient Instructions (Signed)
If you are age 68 or older, your body mass index should be between 23-30. Your Body mass index is 25.73 kg/m. If this is out of the aforementioned range listed, please consider follow up with your Primary Care Provider.  If you are age 60 or younger, your body mass index should be between 19-25. Your Body mass index is 25.73 kg/m. If this is out of the aformentioned range listed, please consider follow up with your Primary Care Provider.   DISCONTINUE Antibiotics.  You have been placed on recall for a routine colonoscopy 2029.  Follow up as needed in the interim  Thank you for choosing me and [facility].   Scarlette Shorts, MD

## 2019-09-28 DIAGNOSIS — K2100 Gastro-esophageal reflux disease with esophagitis, without bleeding: Secondary | ICD-10-CM | POA: Diagnosis not present

## 2019-09-28 DIAGNOSIS — I10 Essential (primary) hypertension: Secondary | ICD-10-CM | POA: Diagnosis not present

## 2019-09-28 DIAGNOSIS — E782 Mixed hyperlipidemia: Secondary | ICD-10-CM | POA: Diagnosis not present

## 2019-10-12 DIAGNOSIS — N401 Enlarged prostate with lower urinary tract symptoms: Secondary | ICD-10-CM | POA: Diagnosis not present

## 2019-10-12 DIAGNOSIS — N3020 Other chronic cystitis without hematuria: Secondary | ICD-10-CM | POA: Diagnosis not present

## 2019-10-12 DIAGNOSIS — N318 Other neuromuscular dysfunction of bladder: Secondary | ICD-10-CM | POA: Diagnosis not present

## 2020-03-04 DIAGNOSIS — N3020 Other chronic cystitis without hematuria: Secondary | ICD-10-CM | POA: Diagnosis not present

## 2020-03-04 DIAGNOSIS — N318 Other neuromuscular dysfunction of bladder: Secondary | ICD-10-CM | POA: Diagnosis not present

## 2020-03-04 DIAGNOSIS — N401 Enlarged prostate with lower urinary tract symptoms: Secondary | ICD-10-CM | POA: Diagnosis not present

## 2020-05-08 DIAGNOSIS — Z96652 Presence of left artificial knee joint: Secondary | ICD-10-CM | POA: Diagnosis not present

## 2020-05-08 DIAGNOSIS — Z471 Aftercare following joint replacement surgery: Secondary | ICD-10-CM | POA: Diagnosis not present

## 2020-08-16 DIAGNOSIS — S0181XA Laceration without foreign body of other part of head, initial encounter: Secondary | ICD-10-CM | POA: Diagnosis not present

## 2020-10-14 DIAGNOSIS — E782 Mixed hyperlipidemia: Secondary | ICD-10-CM | POA: Diagnosis not present

## 2020-10-14 DIAGNOSIS — I10 Essential (primary) hypertension: Secondary | ICD-10-CM | POA: Diagnosis not present

## 2020-10-14 DIAGNOSIS — Z125 Encounter for screening for malignant neoplasm of prostate: Secondary | ICD-10-CM | POA: Diagnosis not present

## 2020-10-14 DIAGNOSIS — Z23 Encounter for immunization: Secondary | ICD-10-CM | POA: Diagnosis not present

## 2020-10-14 DIAGNOSIS — Z0000 Encounter for general adult medical examination without abnormal findings: Secondary | ICD-10-CM | POA: Diagnosis not present

## 2020-10-14 DIAGNOSIS — K219 Gastro-esophageal reflux disease without esophagitis: Secondary | ICD-10-CM | POA: Diagnosis not present

## 2020-11-27 DIAGNOSIS — H25093 Other age-related incipient cataract, bilateral: Secondary | ICD-10-CM | POA: Diagnosis not present

## 2020-11-27 DIAGNOSIS — H31002 Unspecified chorioretinal scars, left eye: Secondary | ICD-10-CM | POA: Diagnosis not present

## 2021-05-29 DIAGNOSIS — H0012 Chalazion right lower eyelid: Secondary | ICD-10-CM | POA: Diagnosis not present

## 2021-08-10 DIAGNOSIS — L259 Unspecified contact dermatitis, unspecified cause: Secondary | ICD-10-CM | POA: Diagnosis not present

## 2021-11-04 DIAGNOSIS — R454 Irritability and anger: Secondary | ICD-10-CM | POA: Diagnosis not present

## 2021-11-04 DIAGNOSIS — Z87442 Personal history of urinary calculi: Secondary | ICD-10-CM | POA: Diagnosis not present

## 2021-11-04 DIAGNOSIS — R413 Other amnesia: Secondary | ICD-10-CM | POA: Diagnosis not present

## 2021-11-04 DIAGNOSIS — F5101 Primary insomnia: Secondary | ICD-10-CM | POA: Diagnosis not present

## 2021-11-06 DIAGNOSIS — F5101 Primary insomnia: Secondary | ICD-10-CM | POA: Diagnosis not present

## 2021-11-06 DIAGNOSIS — Z87442 Personal history of urinary calculi: Secondary | ICD-10-CM | POA: Diagnosis not present
